# Patient Record
Sex: Female | Born: 2000 | Race: White | Hispanic: No | Marital: Single | Smoking: Never smoker
Health system: Southern US, Community
[De-identification: ages and names within clinical notes are randomized; demographics above are authoritative.]

## PROBLEM LIST (undated history)

## (undated) DIAGNOSIS — F419 Anxiety disorder, unspecified: Secondary | ICD-10-CM

## (undated) DIAGNOSIS — G43909 Migraine, unspecified, not intractable, without status migrainosus: Secondary | ICD-10-CM

## (undated) HISTORY — DX: Anxiety disorder, unspecified: F41.9

## (undated) HISTORY — PX: WISDOM TOOTH EXTRACTION: SHX21

## (undated) HISTORY — DX: Migraine, unspecified, not intractable, without status migrainosus: G43.909

---
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
## 2022-01-03 ENCOUNTER — Encounter: Payer: Self-pay | Admitting: Family

## 2022-01-04 ENCOUNTER — Other Ambulatory Visit: Payer: Self-pay | Admitting: Family

## 2022-01-04 DIAGNOSIS — F902 Attention-deficit hyperactivity disorder, combined type: Secondary | ICD-10-CM

## 2022-01-04 MED ORDER — LISDEXAMFETAMINE DIMESYLATE 20 MG PO CAPS: 40.0000 mg | ORAL_CAPSULE | Freq: Every day | ORAL | 0 refills | Status: DC

## 2022-01-14 ENCOUNTER — Telehealth (INDEPENDENT_AMBULATORY_CARE_PROVIDER_SITE_OTHER): Payer: BC Managed Care – PPO | Admitting: Neurology

## 2022-01-14 ENCOUNTER — Encounter: Payer: Self-pay | Admitting: Neurology

## 2022-01-14 DIAGNOSIS — G43709 Chronic migraine without aura, not intractable, without status migrainosus: Secondary | ICD-10-CM

## 2022-01-14 MED ORDER — AJOVY 225 MG/1.5ML ~~LOC~~ SOAJ: 225.0000 mg | SUBCUTANEOUS | 11 refills | Status: DC

## 2022-01-14 MED ORDER — SUMATRIPTAN SUCCINATE 100 MG PO TABS
ORAL_TABLET | ORAL | 6 refills | Status: DC
Start: 2022-01-14 — End: 2023-02-26

## 2022-01-14 MED ORDER — AJOVY 225 MG/1.5ML ~~LOC~~ SOAJ: 225.0000 mg | SUBCUTANEOUS | 4 refills | Status: DC

## 2022-01-14 NOTE — Progress Notes (Signed)
[facility]    Provider:  Dr Lucia Gaskins Requesting Provider: [name] found Primary Care Provider:  Pcp, No  Virtual Visit via Video Note  I connected with Katie Roberts on 01/14/22 at  3:00 PM EST by a video enabled telemedicine application and verified that I am speaking with the correct person using two identifiers.  Location: Patient: home Provider: office   I discussed the limitations of evaluation and management by telemedicine and the availability of in person appointments. The patient expressed understanding and agreed to proceed.    Follow Up Instructions:    I discussed the assessment and treatment plan with the patient. The patient was provided an opportunity to ask questions and all were answered. The patient agreed with the plan and demonstrated an understanding of the instructions.   The patient was advised to call back or seek an in-person evaluation if the symptoms worsen or if the condition fails to improve as anticipated.  I provided 25 minutes of non-face-to-face time during this encounter.   Anson Fret, MD   CC:  migraines  01/14/2022: No side effects to Ajovy. Extremely improved on Ajovy. She has not woken up or gone to bed with a headache. And the ones she has are mild. imitres works. Continue current regimen, discussed nurtec/ubrelvy but imitrex works  Reviewed images and agree: (additional 10 minutes to appointment time) CTA NECK FINDINGS   Aortic arch: Standard branching. Imaged portion shows no evidence of aneurysm or dissection. No significant stenosis of the major arch vessel origins.   Right carotid system: Detail limited by mild motion. Right carotid system appears normal without stenosis or dissection.   Left carotid system: Detail limited by motion. Normal left carotid without stenosis or dissection.   Vertebral arteries: Both vertebral arteries are patent. Detail limited by motion.   Skeleton:  Negative   Other neck: Negative for mass or adenopathy in the neck.   Upper chest: Lung apices clear bilaterally.   Review of the MIP images confirms the above findings   CTA HEAD FINDINGS   Anterior circulation: Internal carotid artery normally through the skull base and cavernous segment. Anterior and middle cerebral arteries normal bilaterally   Posterior circulation: Both vertebral arteries patent to the basilar. PICA patent bilaterally. Basilar widely patent. Superior cerebellar and posterior cerebral arteries patent bilaterally.   Venous sinuses: Normal venous enhancement   Anatomic variants: None   Review of the MIP images confirms the above findings   IMPRESSION: 1. Image quality degraded by patient motion especially in the neck component of the study. 2. Negative for carotid dissection. Normal carotid and vertebral arteries, allowing for motion 3. Normal CT angio head 4. Normal CT head    Patient complains of symptoms per HPI as well as the following symptoms: migraine . Pertinent negatives and positives per HPI. All others negative   HPI 09/11/2021:  Katie Roberts is a 21 y.o. female here as requested by [name] found for migraines.  She has a past medical history of anxiety, migraines, TMJ pain, sternocleidomastoid muscle tenderness, mixed obsessional thoughts and acts, low ferritin, hair loss, generalized abdominal pain, fatigue, bone marrow donor, B12 deficiency, ADHD, adjustment disorder with anxious mood.  I reviewed notes from Maryjane Hurter who also mentioned OCD, anxiety, depression.  She has a therapist and a psychiatrist that appears.  I reviewed pediatric neurology's notes Dr. Keturah Shavers who she has been seeing for headaches and migraines.  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  Neurologic exam which was extensive was normal.  Last they tried amitriptyline replacing propranolol, continued her on magnesium and vitamin B2, it does not appear as though brain imaging was completed.  It appears [facility], saw Alison Stallingaylor Moore, PA, noted she was getting lots of migraines because of exams, sumatriptan not helping, associated dizziness and nausea, pressure around the ears, photophobic, a Toradol shot was prescribed.  Patient is here and reports that her headaches have been ongoing for many years, mostly right-sided, with tbut around the eye area, photophobia, phonophobia, pulsating pounding throbbing, hurts to move, no aura, nausea no vomiting. She has at least 8 migraine days a month, daily headaches ongoing for > 1 year (started getting headaches since age of 21) radiates to the right side of the neck which is new and worsening (points to the right carotid artery), in the last year radiates to the right side of the neck, worsened since January. At least 24 hours and overnight and worse in the mornings supine, gets blurry vision, can wake with headaches/migraines, sleep and stress can be a trigger, they can be moderate to severe and interfering with life. We spoke about aura and vertigo. She has vertigo and dizziness. No other focal neurologic deficits, associated symptoms, inciting events or modifiable factors.   Reviewed notes, labs and imaging from outside physicians, which showed:  From a thorough review of records, medications tried that can be used in migraine management include: Propranolol, SSRIs, amitriptyline, Flexeril, Lexapro, Prozac, Medrol Dosepak, riboflavin B2, Zoloft,  sumatriptan(helps),maxalt, topiramate contraindicated due to depression.   Review of Systems: Patient complains of symptoms per HPI as well as the following symptoms neck pain. Pertinent negatives and positives per HPI. All others negative.   Social History   Socioeconomic History   Marital status: Single    Spouse name: Not on file   Number of children: Not on file   Years of education: Not on file   Highest education level: Not on file  Occupational History   Not on file  Tobacco Use   Smoking status: Never   Smokeless tobacco: Never  Vaping Use   Vaping Use: Never used  Substance and Sexual Activity   Alcohol use: Not on file   Drug use: Not on file   Sexual activity: Never  Other Topics Concern   Not on file  Social History Narrative   Lives with mom, dad and sibs. She is in the 12th grade at [employer] 22-23 school year.   Social Determinants of Health   Financial Resource Strain: Not on file  Food Insecurity: Not on file  Transportation Needs: Not on file  Physical Activity: Not on file  Stress: Not on file  Social Connections: Not on file  Intimate Partner Violence: Not on file    Family History  Problem Relation Age of Onset   Headache Maternal Uncle    Heart failure Paternal Grandmother    Migraines Neg Hx    Seizures Neg Hx    Autism Neg Hx  ADD / ADHD Neg Hx    Anxiety disorder Neg Hx    Depression Neg Hx    Bipolar disorder Neg Hx    Schizophrenia Neg Hx     Past Medical History:  Diagnosis Date   Anxiety    [employer] 12/29/2019   Migraine     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   Sternocleidomastoid muscle tenderness 07/03/2021   Dysmenorrhea 07/03/2021   Hair loss 05/02/2021   Low ferritin 03/27/2021   Vitamin D deficiency 03/27/2021   B12 deficiency 05/02/2020   Attention deficit hyperactivity disorder  (ADHD), combined type 05/02/2020   Fatigue 04/17/2020   Tension headache 12/29/2019   Chronic migraine without aura without status migrainosus, not intractable 12/29/2019   Anxiety disorder 08/23/2019   Patient prefers no residents 06/23/2019   Mixed obsessional thoughts and acts 01/07/2018   Adjustment disorder with anxious mood 01/07/2018   Generalized abdominal pain 07/09/2017   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     ALTAVERA 0.15-30 MG-MCG tablet TAKE 1 TABLET BY MOUTH EVERY DAY 84 tablet 4   amphetamine-dextroamphetamine (ADDERALL) 5 MG tablet Take 1-2 tablets (5-10 mg total) by mouth daily with lunch. 60 tablet 0   cyanocobalamin (,VITAMIN B-12,) 1000 MCG/ML injection Inject 1 mL (1,000 mcg total) into the muscle every 30 (thirty) days. 3 mL 4   EPINEPHrine 0.3 mg/0.3 mL IJ SOAJ injection SMARTSIG:0.3 Milliliter(s) IM Once PRN     Fremanezumab-vfrm (AJOVY) 225 MG/1.5ML SOAJ Inject 225 mg into the skin every 30 (thirty) days. 4.5 mL 4   levocetirizine (XYZAL) 5 MG tablet Take 5 mg by mouth every evening. PRN     lisdexamfetamine (VYVANSE) 20 MG capsule Take 2 capsules (40 mg total) by mouth daily with breakfast. 60 capsule 0   lisdexamfetamine (VYVANSE) 40 MG capsule Take 1 capsule (40 mg total) by mouth every morning. 30 capsule 0   lisdexamfetamine (VYVANSE) 40 MG capsule Take 1 capsule (40 mg total) by mouth every morning. 30 capsule 0   Magnesium Oxide 400 (240 Mg) MG TABS Take by mouth.      mometasone (NASONEX) 50 MCG/ACT nasal spray Place 2 sprays into the nose daily. 1 each 12   Olopatadine HCl 0.2 % SOLN Apply to eye.     Riboflavin (VITAMIN B-2 PO) Take by mouth.     sertraline (ZOLOFT)  100 MG tablet TAKE 1&1/2 TABLETS (150MG  TOTAL) BY MOUTH DAILY 135 tablet 1   SUMAtriptan (IMITREX) 100 MG tablet May repeat in 2 hours if headache persists or recurs. 18 tablet 6   No current facility-administered medications for this visit.    Allergies as of 01/14/2022 - Review Complete 12/17/2021  Allergen Reaction Noted   Other Shortness Of Breath 12/29/2019   Pollen extract  06/04/2021    Vitals: There were no vitals taken for this visit. Last Weight:  Wt Readings from Last 1 Encounters:  09/13/21 152 lb 12.8 oz (69.3 kg) (84 %, Z= 1.01)*   * Growth percentiles are based on CDC (Girls, 2-20 Years) data.   Last Height:  Ht Readings from Last 1 Encounters:  09/13/21 5' 8.5" (1.74 m) (95 %, Z= 1.67)*   * Growth percentiles are based on CDC (Girls, 2-20 Years) data.    Physical exam: Exam: Gen: NAD, conversant      CV: Could not perform over Web Video. Denies palpitations or chest pain or SOB. VS: Breathing at a normal rate. Weight appears within normal limits. Not febrile. Eyes: Conjunctivae clear without exudates or hemorrhage  Neuro: Detailed Neurologic Exam  Speech:    Speech is normal; fluent and spontaneous with normal comprehension.  Cognition:    The patient is oriented to person, place, and time;     recent and remote memory intact;     language fluent;     normal attention, concentration,     fund of knowledge Cranial Nerves:    The pupils are equal, round, and reactive to light. Cannot perform fundoscopic exam. Visual fields are full to finger confrontation. Extraocular movements are intact.  The face is symmetric with normal sensation. The palate elevates in the midline. Hearing intact. Voice is normal. Shoulder shrug is normal. The tongue has normal motion without fasciculations.   Coordination:    Normal finger to nose  Gait:    Normal native gait  Motor Observation:   no involuntary movements noted. Tone:    Appears normal  Posture:     Posture is normal. normal erect    Strength:    Strength is anti-gravity and symmetric in the upper and lower limbs.      Sensation: intact to LT        Assessment/Plan:  Patient with chronic migraines doing great  continue Ajovy  Acutely: Sumatriptan (also lots of new meds ubrelvy or nurtex, zavzpret), discussed  Discussed: teratogenicity do not get pregnant washout is 6 months  Meds ordered this encounter  Medications   DISCONTD: Fremanezumab-vfrm (AJOVY) 225 MG/1.5ML SOAJ    Sig: Inject 225 mg into the skin every 30 (thirty) days.    Dispense:  1.5 mL    Refill:  11   SUMAtriptan (IMITREX) 100 MG tablet    Sig: May repeat in 2 hours if headache persists or recurs.    Dispense:  18 tablet    Refill:  6    Fill 18 or max allowed by insurance.   Fremanezumab-vfrm (AJOVY) 225 MG/1.5ML SOAJ    Sig: Inject 225 mg into the skin every 30 (thirty) days.    Dispense:  4.5 mL    Refill:  4    Cc: [name] found,  Pcp, No  Naomie Dean, MD  [facility] [address] [place], [place] [address]  Phone [phone] Fax [phone]

## 2022-02-20 ENCOUNTER — Encounter: Payer: Self-pay | Admitting: Family

## 2022-03-01 ENCOUNTER — Encounter (INDEPENDENT_AMBULATORY_CARE_PROVIDER_SITE_OTHER): Payer: Self-pay

## 2022-03-01 ENCOUNTER — Other Ambulatory Visit (INDEPENDENT_AMBULATORY_CARE_PROVIDER_SITE_OTHER): Payer: Self-pay | Admitting: Neurology

## 2022-03-01 NOTE — Telephone Encounter (Signed)
Automated Rx refill request received from [facility]), Request Denied, Patient was able to get Rx filled with 6 refills elsewhere. (See below):  Note: MyChart message sent to patient.  B. Roten CMA   SUMAtriptan (IMITREX) 100 MG tablet [[id number]]   Order Details Dose, Route, Frequency: As Directed  Dispense Quantity: 18 tablet Refills: 6   Note to Pharmacy: Fill 18 or max allowed by insurance.  Indications of Use: Migraine       Sig: May repeat in 2 hours if headache persists or recurs.       Start Date: 01/14/22 End Date: --  Written Date: 01/14/22 Expiration Date: 01/14/23  Original Order: SUMAtriptan (IMITREX) 50 MG tablet [[id number]]  Providers  Authorizing Provider: Melvenia Beam, [place], [place] [address] Phone: [phone]   Fax: [phone] DEA #: [id number]   NPI: [id number]      Ordering User: Melvenia Beam, MD      Pharmacy  [facility], [address] [place] [address] Phone: [phone]  Fax: [phone]

## 2022-03-14 ENCOUNTER — Other Ambulatory Visit: Payer: Self-pay | Admitting: Family

## 2022-03-14 ENCOUNTER — Encounter: Payer: Self-pay | Admitting: Family

## 2022-03-14 MED ORDER — LISDEXAMFETAMINE DIMESYLATE 20 MG PO CAPS: 40.0000 mg | ORAL_CAPSULE | Freq: Every day | ORAL | 0 refills | Status: DC

## 2022-03-18 ENCOUNTER — Telehealth (INDEPENDENT_AMBULATORY_CARE_PROVIDER_SITE_OTHER): Payer: BC Managed Care – PPO | Admitting: Family

## 2022-03-18 DIAGNOSIS — F4323 Adjustment disorder with mixed anxiety and depressed mood: Secondary | ICD-10-CM

## 2022-03-18 DIAGNOSIS — F902 Attention-deficit hyperactivity disorder, combined type: Secondary | ICD-10-CM | POA: Diagnosis not present

## 2022-03-18 NOTE — Progress Notes (Unsigned)
THIS RECORD MAY CONTAIN CONFIDENTIAL INFORMATION THAT SHOULD NOT BE RELEASED WITHOUT REVIEW OF THE SERVICE PROVIDER.  Virtual Follow-Up Visit via Video Note  I connected with Katie Roberts on 03/18/22 at  4:30 PM EST by a video enabled telemedicine application and verified that I am speaking with the correct person using two identifiers.   Patient/parent location: school gym, [place],  [place]  Provider location: office   I discussed the limitations of evaluation and management by telemedicine and the availability of in person appointments.  I discussed that the purpose of this telehealth visit is to provide medical care while limiting exposure to the novel coronavirus.  The patient expressed understanding and agreed to proceed.   Katie Roberts is a 22 y.o. female referred by [name] found here today for follow-up of ADHD.   History was provided by the patient.  Supervising Physician: Dr. Roselind Messier   Plan from Last Visit 12/17/21  1. Attention deficit hyperactivity disorder (ADHD), combined type -continue with Vyvanse 40 mg; does not need short-acting at this time  - lisdexamfetamine (VYVANSE) 40 MG capsule; Take 1 capsule (40 mg total) by mouth every morning.  Dispense: 30 capsule; Refill: 0   2. Adjustment disorder with mixed anxiety and depressed mood -continue with sertraline 150 mg    3. Encounter for birth control pills maintenance -reviewed safe use; EC and condom use  -discussed return precautions     Chief Complaint: ADHD medication - needs refill   History of Present Illness:  -refill Vyvanse 20 mg x 2 for 40 mg total sent on 1/25 -no concerns; things better since back at school  -noticed that her anxiety worsened when she was home for break, then resolved when returned to campus/her typical routine  -taking meds as prescribed; no concerns  -no SI/HI  -no headaches (migraines well-managed by Neuro), no chest pain or SOB, no stomach pains    Allergies   Allergen Reactions   Other Shortness Of Breath    Environmental   Pollen Extract    Outpatient Medications Prior to Visit  Medication Sig Dispense Refill   lisdexamfetamine (VYVANSE) 20 MG capsule Take 2 capsules (40 mg total) by mouth daily. 60 capsule 0   albuterol (VENTOLIN HFA) 108 (90 Base) MCG/ACT inhaler Inhale into the lungs every 6 (six) hours as needed for wheezing or shortness of breath.     ALTAVERA 0.15-30 MG-MCG tablet TAKE 1 TABLET BY MOUTH EVERY DAY 84 tablet 4   amphetamine-dextroamphetamine (ADDERALL) 5 MG tablet Take 1-2 tablets (5-10 mg total) by mouth daily with lunch. 60 tablet 0   cyanocobalamin (,VITAMIN B-12,) 1000 MCG/ML injection Inject 1 mL (1,000 mcg total) into the muscle every 30 (thirty) days. 3 mL 4   EPINEPHrine 0.3 mg/0.3 mL IJ SOAJ injection SMARTSIG:0.3 Milliliter(s) IM Once PRN     Fremanezumab-vfrm (AJOVY) 225 MG/1.5ML SOAJ Inject 225 mg into the skin every 30 (thirty) days. 4.5 mL 4   levocetirizine (XYZAL) 5 MG tablet Take 5 mg by mouth every evening. PRN     Magnesium Oxide 400 (240 Mg) MG TABS Take by mouth.      mometasone (NASONEX) 50 MCG/ACT nasal spray Place 2 sprays into the nose daily. 1 each 12   Olopatadine HCl 0.2 % SOLN Apply to eye.     Riboflavin (VITAMIN B-2 PO) Take by mouth.     sertraline (ZOLOFT) 100 MG tablet TAKE 1&1/2 TABLETS (150MG  TOTAL) BY MOUTH DAILY 135 tablet 1  SUMAtriptan (IMITREX) 100 MG tablet May repeat in 2 hours if headache persists or recurs. 18 tablet 6   No facility-administered medications prior to visit.     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   Sternocleidomastoid muscle tenderness 07/03/2021   Dysmenorrhea 07/03/2021   Hair loss 05/02/2021   Low ferritin 03/27/2021   Vitamin D deficiency 03/27/2021   B12 deficiency 05/02/2020   Attention deficit hyperactivity disorder  (ADHD), combined type 05/02/2020   Fatigue 04/17/2020   Tension headache 12/29/2019   Chronic migraine without aura without status migrainosus, not intractable 12/29/2019   Anxiety disorder 08/23/2019   Patient prefers no residents 06/23/2019   Mixed obsessional thoughts and acts 01/07/2018   Adjustment disorder with anxious mood 01/07/2018   Generalized abdominal pain 07/09/2017   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  Visual Observations/Objective:   General Appearance: Well nourished well developed, in no apparent distress.  Eyes: conjunctiva no swelling or erythema ENT/Mouth: No hoarseness, No cough for duration of visit.  Neck: Supple  Respiratory: Respiratory effort normal, normal rate, no retractions or distress.   Cardio: Appears well-perfused, noncyanotic Musculoskeletal: no obvious deformity Skin: visible skin without rashes, ecchymosis, erythema Neuro: Awake and oriented X 3,  Psych:  normal affect, Insight and Judgment appropriate.    Assessment/Plan: 1. 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 mood -continue with sertraline 150 mg  -Vyvanse 40 mg daily  -return in 3 months or sooner if needed     I discussed the assessment and treatment plan with the patient and/or parent/guardian.  They were provided an opportunity to ask questions and all were answered.  They agreed with the plan and demonstrated an understanding of the instructions. They were advised to call back or seek an in-person evaluation in the emergency room if the symptoms worsen or if the condition fails to improve as anticipated.   Follow-up:   3 months    Parthenia Ames, NP    CC: Pcp, No, No ref.  provider found

## 2022-03-20 ENCOUNTER — Encounter: Payer: Self-pay | Admitting: Family

## 2022-05-29 ENCOUNTER — Encounter: Payer: Self-pay | Admitting: Family

## 2022-08-12 ENCOUNTER — Encounter: Payer: Self-pay | Admitting: Family

## 2022-08-13 ENCOUNTER — Other Ambulatory Visit: Payer: Self-pay | Admitting: Family

## 2022-08-13 MED ORDER — LISDEXAMFETAMINE DIMESYLATE 20 MG PO CAPS
40.0000 mg | ORAL_CAPSULE | Freq: Every day | ORAL | 0 refills | Status: DC
Start: 2022-08-13 — End: 2022-10-09

## 2022-08-29 ENCOUNTER — Encounter: Payer: Self-pay | Admitting: Family

## 2022-09-13 ENCOUNTER — Emergency Department (HOSPITAL_BASED_OUTPATIENT_CLINIC_OR_DEPARTMENT_OTHER)
Admission: EM | Admit: 2022-09-13 | Discharge: 2022-09-13 | Disposition: A | Discharge status: Home / Self Care | Payer: BC Managed Care – PPO | Attending: Emergency Medicine | Admitting: Emergency Medicine

## 2022-09-13 ENCOUNTER — Other Ambulatory Visit: Payer: Self-pay

## 2022-09-13 ENCOUNTER — Encounter (HOSPITAL_BASED_OUTPATIENT_CLINIC_OR_DEPARTMENT_OTHER): Payer: Self-pay

## 2022-09-13 DIAGNOSIS — H43393 Other vitreous opacities, bilateral: Secondary | ICD-10-CM | POA: Diagnosis present

## 2022-09-13 NOTE — ED Notes (Signed)
All appropriate discharge materials reviewed at length with patient. Time for questions provided. Pt has no other questions at this time and verbalizes understanding of all provided materials.  

## 2022-09-13 NOTE — ED Provider Notes (Signed)
[place] [facility] AT [facility] Provider Note   CSN: [id number] Arrival date & time: 09/13/22  1825     History {Add pertinent medical, surgical, social history, OB history to HPI:1} Chief Complaint  Patient presents with   Eye Problem    Katie Roberts is a 22 y.o. female history of anxiety, migraines presented today for evaluation of floaters in her vision for 1 week.  Patient states about a week ago she started to see some black spots in her vision with squeaky lines.  Symptoms waxing and waning. She denies any recent fall or head injury.  She has never had similar symptoms in the past.  Denies any fever, nausea or vomiting.  Denies any vision loss.   Eye Problem     Past Medical History:  Diagnosis Date   Anxiety    [employer] 12/29/2019   Migraine    Past Surgical History:  Procedure Laterality Date   WISDOM TOOTH EXTRACTION       Home Medications Prior to Admission medications   Medication Sig Start Date End Date Taking? Authorizing Provider  albuterol (VENTOLIN HFA) 108 (90 Base) MCG/ACT inhaler Inhale into the lungs every 6 (six) hours as needed for wheezing or shortness of breath.    [provider]  ALTAVERA 0.15-30 MG-MCG tablet TAKE 1 TABLET BY MOUTH EVERY DAY 12/11/21   Georges Mouse, NP  amphetamine-dextroamphetamine (ADDERALL) 5 MG tablet Take 1-2 tablets (5-10 mg total) by mouth daily with lunch. 09/13/21   Verneda Skill, FNP  cyanocobalamin (,VITAMIN B-12,) 1000 MCG/ML injection Inject 1 mL (1,000 mcg total) into the muscle every 30 (thirty) days. 05/01/21   Verneda Skill, FNP  EPINEPHrine 0.3 mg/0.3 mL IJ SOAJ injection SMARTSIG:0.3 Milliliter(s) IM Once PRN 08/01/21   [provider]  Fremanezumab-vfrm (AJOVY) 225 MG/1.5ML SOAJ Inject 225 mg into the skin every 30 (thirty) days. 01/14/22   Anson Fret, MD  levocetirizine (XYZAL) 5 MG tablet Take 5 mg by mouth every evening. PRN    [provider]  lisdexamfetamine (VYVANSE) 20 MG capsule Take 2 capsules (40 mg total) by mouth daily. 08/13/22   Georges Mouse, NP  Magnesium Oxide 400 (240 Mg) MG TABS Take by mouth.     [provider]  mometasone (NASONEX) 50 MCG/ACT nasal spray Place 2 sprays into the nose daily. 08/07/21   Verneda Skill, FNP  Olopatadine HCl 0.2 % SOLN Apply to eye.    [provider]  Riboflavin (VITAMIN B-2 PO) Take by mouth.    [provider]  sertraline (ZOLOFT) 100 MG tablet TAKE 1&1/2 TABLETS (150MG  TOTAL) BY MOUTH DAILY 09/13/21   Verneda Skill, FNP  SUMAtriptan (IMITREX) 100 MG tablet May repeat in 2 hours if headache persists or recurs. 01/14/22   Anson Fret, MD      Allergies    Other and Pollen extract    Review of Systems   Review of Systems Negative except as per HPI.  Physical Exam Updated Vital Signs BP 119/81   Pulse 66   Temp 98.2 F (36.8 C)   Resp 20   Ht 5\' 9"  (1.753 m)   Wt 73.5 kg   SpO2 100%   BMI 23.92 kg/m  Physical Exam Vitals and nursing note reviewed.  Constitutional:      Appearance: Normal appearance.  HENT:     Head: Normocephalic and atraumatic.     Mouth/Throat:     Mouth: Mucous  membranes are moist.  Eyes:     General: No scleral icterus. Cardiovascular:     Rate and Rhythm: Normal rate and regular rhythm.     Pulses: Normal pulses.     Heart sounds: Normal heart sounds.  Pulmonary:     Effort: Pulmonary effort is normal.     Breath sounds: Normal breath sounds.  Abdominal:     General: Abdomen is flat.     Palpations: Abdomen is soft.     Tenderness: There is no abdominal tenderness.  Musculoskeletal:        General: No deformity.  Skin:    General: Skin is warm.     Findings: No rash.  Neurological:     General: No focal deficit present.     Mental Status: She is alert.  Psychiatric:        Mood and Affect: Mood normal.     ED Results / Procedures / Treatments   Labs (all labs ordered  are listed, but only abnormal results are displayed) Labs Reviewed - No data to display  EKG None  Radiology No results found.  Procedures Procedures  {Document cardiac monitor, telemetry assessment procedure when appropriate:1}  Medications Ordered in ED Medications - No data to display  ED Course/ Medical Decision Making/ A&P   {   Click here for ABCD2, HEART and other calculatorsREFRESH Note before signing :1}                          Medical Decision Making  This patient presents to the ED for floaters in vision, this involves an extensive number of treatment options, and is a complaint that carries with a high risk of complications and morbidity.  The differential diagnosis includes ***.  This is not an exhaustive list.  Lab tests: I ordered and personally interpreted labs.  The pertinent results include: WBC unremarkable. Hbg unremarkable. Platelets unremarkable. Electrolytes unremarkable. BUN, creatinine unremarkable. ***  Imaging studies: I ordered imaging studies, personally reviewed, interpreted imaging and agree with the radiologist's interpretations. The results include: ***   Problem list/ ED course/ Critical interventions/ Medical management: HPI: See above Vital signs ***within normal range and stable throughout visit. Laboratory/imaging studies significant for: See above. On physical examination, patient is afebrile and appears in no acute distress. *** I have reviewed the patient home medicines and have made adjustments as needed.  Cardiac monitoring/EKG: The patient was maintained on a cardiac monitor.  I personally reviewed and interpreted the cardiac monitor which showed an underlying rhythm of: sinus rhythm.  Additional history obtained: External records from outside source obtained and reviewed including: Chart review including previous notes, labs, imaging.  Consultations obtained:  Disposition Continued outpatient therapy. Follow-up with PCP  recommended for reevaluation of symptoms. Treatment plan discussed with patient.  Pt acknowledged understanding was agreeable to the plan. Worrisome signs and symptoms were discussed with patient, and patient acknowledged understanding to return to the ED if they noticed these signs and symptoms. Patient was stable upon discharge.   This chart was dictated using voice recognition software.  Despite best efforts to proofread,  errors can occur which can change the documentation meaning.    {Document critical care time when appropriate:1} {Document review of labs and clinical decision tools ie heart score, Chads2Vasc2 etc:1}  {Document your independent review of radiology images, and any outside records:1} {Document your discussion with family members, caretakers, and with consultants:1} {Document social determinants of health affecting pt's care:1} {  Document your decision making why or why not admission, treatments were needed:1} Final Clinical Impression(s) / ED Diagnoses Final diagnoses:  Floaters in visual field, bilateral    Rx / DC Orders ED Discharge Orders     None

## 2022-09-13 NOTE — ED Triage Notes (Signed)
Pt reports floaters in her vision X 1 week.

## 2022-09-13 NOTE — Discharge Instructions (Addendum)
I recommend close follow-up with ophthalmology for reevaluation.  Please do not hesitate to return to emergency department if worrisome signs symptoms we discussed become apparent.

## 2022-10-03 ENCOUNTER — Encounter: Payer: Self-pay | Admitting: Family

## 2022-10-04 ENCOUNTER — Other Ambulatory Visit: Payer: Self-pay | Admitting: Family

## 2022-10-09 ENCOUNTER — Other Ambulatory Visit: Payer: Self-pay | Admitting: Family

## 2022-10-09 DIAGNOSIS — F422 Mixed obsessional thoughts and acts: Secondary | ICD-10-CM

## 2022-10-09 DIAGNOSIS — F902 Attention-deficit hyperactivity disorder, combined type: Secondary | ICD-10-CM

## 2022-10-09 MED ORDER — LISDEXAMFETAMINE DIMESYLATE 20 MG PO CAPS: 40.0000 mg | ORAL_CAPSULE | Freq: Every day | ORAL | 0 refills | Status: DC

## 2022-10-09 MED ORDER — AMPHETAMINE-DEXTROAMPHETAMINE 5 MG PO TABS: 5.0000 mg | ORAL_TABLET | Freq: Every day | ORAL | 0 refills | Status: DC

## 2022-10-09 MED ORDER — SERTRALINE HCL 100 MG PO TABS: ORAL_TABLET | ORAL | 1 refills | Status: DC

## 2022-10-16 ENCOUNTER — Telehealth (INDEPENDENT_AMBULATORY_CARE_PROVIDER_SITE_OTHER): Payer: BC Managed Care – PPO | Admitting: Family

## 2022-10-16 ENCOUNTER — Encounter: Payer: Self-pay | Admitting: Family

## 2022-10-16 DIAGNOSIS — F902 Attention-deficit hyperactivity disorder, combined type: Secondary | ICD-10-CM

## 2022-10-16 DIAGNOSIS — F4323 Adjustment disorder with mixed anxiety and depressed mood: Secondary | ICD-10-CM

## 2022-10-16 NOTE — Progress Notes (Addendum)
THIS RECORD MAY CONTAIN CONFIDENTIAL INFORMATION THAT SHOULD NOT BE RELEASED WITHOUT REVIEW OF THE SERVICE PROVIDER.  Virtual Follow-Up Visit via Video Note  I connected with Katie Roberts  on 10/16/22 at  3:30 PM EDT by a video enabled telemedicine application and verified that I am speaking with the correct person using two identifiers.   Patient/parent location: [employer], confidential room, Aeronautical engineer location: remote [place]    I discussed the limitations of evaluation and management by telemedicine and the availability of in person appointments.  I discussed that the purpose of this telehealth visit is to provide medical care while limiting exposure to the novel coronavirus.  The patient expressed understanding and agreed to proceed.   Katie Roberts is a 22 y.o. female referred by [name] found here today for follow-up of adjustment disorder with mixed anxiety and depressed mood, ADHD.   History was provided by the patient.  Supervising Physician: Dr. Theadore Nan   Plan from Last Visit 03/18/22    1. 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 mood -continue with sertraline 150 mg  -Vyvanse 40 mg daily  -return in 3 months or sooner if needed   Chief Complaint: Restarted meds   History of Present Illness:  -restarted all her meds after about a month off them and did not feel well yesterday; had some racing heart and an upset stomach, didn't feel great -today took only sertraline 50 mg and the Vyvanse and feels better -no headache, no upset stomach, no racing heart rate today  -feels like her mood is good, is able to focus   -LMP last week  -had stabbing pelvic pain off and on for a while - 30 min to a couple seconds for about a week and a half; having some external down there pain (labial) that feels like tingling; no pain or burning with urination; no fever, nausea or  vomiting; questioned if she had a UTI; saw campus MD and also was tested for STIs and that was negative; was advised to return if symptoms continued    Allergies  Allergen Reactions   Other Shortness Of Breath    Environmental   Pollen Extract    Outpatient Medications Prior to Visit  Medication Sig Dispense Refill   albuterol (VENTOLIN HFA) 108 (90 Base) MCG/ACT inhaler Inhale into the lungs every 6 (six) hours as needed for wheezing or shortness of breath.     ALTAVERA 0.15-30 MG-MCG tablet TAKE 1 TABLET BY MOUTH EVERY DAY 84 tablet 4   amphetamine-dextroamphetamine (ADDERALL) 5 MG tablet Take 1-2 tablets (5-10 mg total) by mouth daily with lunch. 60 tablet 0   cyanocobalamin (,VITAMIN B-12,) 1000 MCG/ML injection Inject 1 mL (1,000 mcg total) into the muscle every 30 (thirty) days. 3 mL 4   EPINEPHrine 0.3 mg/0.3 mL IJ SOAJ injection SMARTSIG:0.3 Milliliter(s) IM Once PRN     Fremanezumab-vfrm (AJOVY) 225 MG/1.5ML SOAJ Inject 225 mg into the skin every 30 (thirty) days. 4.5 mL 4   levocetirizine (XYZAL) 5 MG tablet Take 5 mg by mouth every evening. PRN     lisdexamfetamine (VYVANSE) 20 MG capsule Take 2 capsules (40 mg total) by mouth daily. 60 capsule 0   Magnesium Oxide 400 (240 Mg) MG TABS Take by mouth.      mometasone (NASONEX) 50 MCG/ACT nasal spray Place 2 sprays into the nose daily. 1 each 12   Olopatadine HCl 0.2 %  SOLN Apply to eye.     Riboflavin (VITAMIN B-2 PO) Take by mouth.     sertraline (ZOLOFT) 100 MG tablet TAKE 1&1/2 TABLETS (150MG  TOTAL) BY MOUTH DAILY 135 tablet 1   SUMAtriptan (IMITREX) 100 MG tablet May repeat in 2 hours if headache persists or recurs. 18 tablet 6   No facility-administered medications prior to visit.     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   Sternocleidomastoid muscle tenderness 07/03/2021   Dysmenorrhea  07/03/2021   Hair loss 05/02/2021   Low ferritin 03/27/2021   Vitamin D deficiency 03/27/2021   B12 deficiency 05/02/2020   Attention deficit hyperactivity disorder (ADHD), combined type 05/02/2020   Fatigue 04/17/2020   Tension headache 12/29/2019   Chronic migraine without aura without status migrainosus, not intractable 12/29/2019   Anxiety disorder 08/23/2019   Patient prefers no residents 06/23/2019   Mixed obsessional thoughts and acts 01/07/2018   Adjustment disorder with anxious mood 01/07/2018   Generalized abdominal pain 07/09/2017   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  Visual Observations/Objective:   General Appearance: Well nourished well developed, in no apparent distress.  Eyes: conjunctiva no swelling or erythema ENT/Mouth: No hoarseness, No cough for duration of visit.  Neck: Supple  Respiratory: Respiratory effort normal, normal rate, no retractions or distress.   Cardio: Appears well-perfused, noncyanotic Musculoskeletal: no obvious deformity Skin: visible skin without rashes, ecchymosis, erythema Neuro: Awake and oriented X 3,  Psych:  normal affect, Insight and Judgment appropriate.    Assessment/Plan: 1. 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 mood -discussed serotonin syndrome symptoms and advised to hold on increasing dose; return precautions reviewed; discussed that she may not need same dose as she was on before; she is in agreement to hold at sertraline 50 mg only   -OK to continue Vyvanse 20 mg daily and Adderall 5 mg as needed c  -advised to return to [facility] to follow up on urinary symptoms/pelvic pain; increase fluid intake; no concern for acute  abdomen.   I discussed the assessment and treatment plan with the patient and/or parent/guardian.  They were provided an opportunity to ask questions and all were answered.  They agreed with the plan and demonstrated an understanding of the instructions. They were advised to call back or seek an in-person evaluation in the emergency room if the symptoms worsen or if the condition fails to improve as anticipated.   Follow-up:   2 weeks video or sooner if needed    Georges Mouse, NP    CC: Pcp, No, [name] found

## 2022-10-18 ENCOUNTER — Encounter: Payer: Self-pay | Admitting: Neurology

## 2022-10-18 ENCOUNTER — Telehealth: Payer: Self-pay

## 2022-10-18 ENCOUNTER — Encounter: Payer: Self-pay | Admitting: Family

## 2022-10-18 NOTE — Telephone Encounter (Signed)
Gave patient a call to schedule 2wks follow up. Patient was currently in a doctors appointment so I advised her to give [place] a call back at the office to schedule her follow up appointment.

## 2022-10-22 MED ORDER — AJOVY 225 MG/1.5ML ~~LOC~~ SOAJ: 225.0000 mg | SUBCUTANEOUS | 0 refills | Status: DC

## 2022-11-08 ENCOUNTER — Telehealth (INDEPENDENT_AMBULATORY_CARE_PROVIDER_SITE_OTHER): Payer: Self-pay | Admitting: Family

## 2022-11-08 ENCOUNTER — Encounter: Payer: Self-pay | Admitting: Family

## 2022-11-08 DIAGNOSIS — F902 Attention-deficit hyperactivity disorder, combined type: Secondary | ICD-10-CM

## 2022-11-08 DIAGNOSIS — F4323 Adjustment disorder with mixed anxiety and depressed mood: Secondary | ICD-10-CM

## 2022-11-08 NOTE — Progress Notes (Signed)
THIS RECORD MAY CONTAIN CONFIDENTIAL INFORMATION THAT SHOULD NOT BE RELEASED WITHOUT REVIEW OF THE SERVICE PROVIDER.  Virtual Follow-Up Visit via Video Note  I connected with Katie Roberts  on 11/08/22 at 10:30 AM EDT by a video enabled telemedicine application and verified that I am speaking with the correct person using two identifiers.   Patient/parent location: [place], [place]  Provider location: remote [place]    I discussed the limitations of evaluation and management by telemedicine and the availability of in person appointments.  I discussed that the purpose of this telehealth visit is to provide medical care while limiting exposure to the novel coronavirus.  The patient expressed understanding and agreed to proceed.   Katie Roberts is a 22 y.o. female referred by [name] found here today for follow-up of ADHD, adjustment disorder with mixed anxiety and depressed mood.   History was provided by the patient.  Supervising Physician: Dr. Theadore Nan   Plan from Last Visit:    1. 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 mood -continue with sertraline 150 mg  -Vyvanse 40 mg daily  -return in 3 months or sooner if needed    Chief Complaint: Restarted meds    History of Present Illness:  -restarted all her meds after about a month off them and did not feel well yesterday; had some racing heart and an upset stomach, didn't feel great -today took only sertraline 50 mg and the Vyvanse and feels better -no headache, no upset stomach, no racing heart rate today  -feels like her mood is good, is able to focus    -LMP last week  -had stabbing pelvic pain off and on for a while - 30 min to a c  Chief Complaint: Upset stomach with medication   History of Present Illness:  -Setraline 50 mg, Vvyanse 40 mg; mood is good but is noticing she will have upset stomach after she takes the  medication -eating breakfast  -had reaction to Flagyl/Diflucan and was seen at ED on 10/18/22 after taking medication and felt dizzy and lightheaded about  3 hours after taking medication; had some nausea, no emesis; some itching and facial flushing. Meds were switched to clindamycin 300 mg twice daily for 6 days outpatient, per chart review.  -no fever, chills, no sick contacts, no constipation, no appetite concerns   Allergies  Allergen Reactions   Other Shortness Of Breath    Environmental   Pollen Extract    Outpatient Medications Prior to Visit  Medication Sig Dispense Refill   albuterol (VENTOLIN HFA) 108 (90 Base) MCG/ACT inhaler Inhale into the lungs every 6 (six) hours as needed for wheezing or shortness of breath.     ALTAVERA 0.15-30 MG-MCG tablet TAKE 1 TABLET BY MOUTH EVERY DAY 84 tablet 4   amphetamine-dextroamphetamine (ADDERALL) 5 MG tablet Take 1-2 tablets (5-10 mg total) by mouth daily with lunch. 60 tablet 0   cyanocobalamin (,VITAMIN B-12,) 1000 MCG/ML injection Inject 1 mL (1,000 mcg total) into the muscle every 30 (thirty) days. 3 mL 4   EPINEPHrine 0.3 mg/0.3 mL IJ SOAJ injection SMARTSIG:0.3 Milliliter(s) IM Once PRN     Fremanezumab-vfrm (AJOVY) 225 MG/1.5ML SOAJ Inject 225 mg into the skin every 30 (thirty) days. 4.5 mL 0   levocetirizine (XYZAL) 5 MG tablet Take 5 mg by mouth every evening. PRN     lisdexamfetamine (VYVANSE) 20 MG capsule Take 2 capsules (40 mg total) by mouth  daily. 60 capsule 0   Magnesium Oxide 400 (240 Mg) MG TABS Take by mouth.      mometasone (NASONEX) 50 MCG/ACT nasal spray Place 2 sprays into the nose daily. 1 each 12   Olopatadine HCl 0.2 % SOLN Apply to eye.     Riboflavin (VITAMIN B-2 PO) Take by mouth.     sertraline (ZOLOFT) 100 MG tablet TAKE 1&1/2 TABLETS (150MG  TOTAL) BY MOUTH DAILY 135 tablet 1   SUMAtriptan (IMITREX) 100 MG tablet May repeat in 2 hours if headache persists or recurs. 18 tablet 6   No facility-administered  medications prior to visit.     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   Sternocleidomastoid muscle tenderness 07/03/2021   Dysmenorrhea 07/03/2021   Hair loss 05/02/2021   Low ferritin 03/27/2021   Vitamin D deficiency 03/27/2021   B12 deficiency 05/02/2020   Attention deficit hyperactivity disorder (ADHD), combined type 05/02/2020   Fatigue 04/17/2020   Tension headache 12/29/2019   Chronic migraine without aura without status migrainosus, not intractable 12/29/2019   Anxiety disorder 08/23/2019   Patient prefers no residents 06/23/2019   Mixed obsessional thoughts and acts 01/07/2018   Adjustment disorder with anxious mood 01/07/2018   Generalized abdominal pain 07/09/2017   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  Visual Observations/Objective:   General Appearance: Well nourished well developed, in no apparent distress.  Eyes: conjunctiva no swelling or erythema ENT/Mouth: No hoarseness, No cough for duration of visit.  Neck: Supple  Respiratory: Respiratory effort normal, normal rate, no retractions or distress.   Cardio: Appears well-perfused, noncyanotic Musculoskeletal: no obvious deformity Skin: visible skin without rashes, ecchymosis, erythema Neuro: Awake and oriented X 3,  Psych:  normal affect, Insight and Judgment appropriate.    Assessment/Plan: 1. 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 mood  Timeline of restarting sertraline and Vyvanse and initiation of medication for BV/vaginal infection by [facility], plus reaction to  Flagyl/Diflucan and switch to clindamycin makes it difficult to ascertain if this is side effect of SSRI or stimulant use or GI upset from antibiotic use. Advised she could take Vyvanse in AM and try sertraline in PM to see if this alleviates symptoms. Also could consider probiotic for gut health. She will try to separate doses and follow up through My Chart. Return precautions reviewed. Reviewed EKG from ED visit; no prolonged QT.    Encounter Date: 10/18/22  ECG 12 Lead  Result Value  EKG Systolic BP  EKG Diastolic BP  EKG Ventricular Rate 88  EKG Atrial Rate 88  EKG P-R Interval 124  EKG QRS Duration 84  EKG Q-T Interval 358  EKG QTC Calculation 433  EKG Calculated P Axis 252  EKG Calculated R Axis 33  EKG Calculated T Axis 25  QTC Fredericia 406  Narrative  Unusual P axis and short PR, probable junctional tachycardia Abnormal ECG No previous ECGs available   I discussed the assessment and treatment plan with the patient and/or parent/guardian.  They were provided an opportunity to ask questions and all were answered.  They agreed with the plan and demonstrated an understanding of the instructions. They were  advised to call back or seek an in-person evaluation in the emergency room if the symptoms worsen or if the condition fails to improve as anticipated.   Follow-up:  as needed, will follow up via My Chart with new or worsening.    Georges Mouse, NP    CC: Pcp, No, [name] found

## 2022-11-20 ENCOUNTER — Encounter: Payer: Self-pay | Admitting: Family

## 2022-12-31 ENCOUNTER — Encounter: Payer: Self-pay | Admitting: Family

## 2022-12-31 ENCOUNTER — Other Ambulatory Visit: Payer: Self-pay | Admitting: Family

## 2022-12-31 ENCOUNTER — Encounter: Payer: Self-pay | Admitting: Neurology

## 2022-12-31 DIAGNOSIS — N946 Dysmenorrhea, unspecified: Secondary | ICD-10-CM

## 2022-12-31 DIAGNOSIS — F422 Mixed obsessional thoughts and acts: Secondary | ICD-10-CM

## 2022-12-31 MED ORDER — AJOVY 225 MG/1.5ML ~~LOC~~ SOAJ: 225.0000 mg | SUBCUTANEOUS | 0 refills | Status: DC

## 2022-12-31 MED ORDER — LEVONORGESTREL-ETHINYL ESTRAD 0.15-30 MG-MCG PO TABS: 1.0000 | ORAL_TABLET | Freq: Every day | ORAL | 4 refills | Status: DC

## 2022-12-31 MED ORDER — LISDEXAMFETAMINE DIMESYLATE 20 MG PO CAPS: 40.0000 mg | ORAL_CAPSULE | Freq: Every day | ORAL | 0 refills | Status: DC

## 2022-12-31 MED ORDER — SERTRALINE HCL 100 MG PO TABS: ORAL_TABLET | ORAL | 1 refills | Status: DC

## 2023-01-06 ENCOUNTER — Other Ambulatory Visit: Payer: Self-pay | Admitting: Family

## 2023-01-08 ENCOUNTER — Encounter: Payer: Self-pay | Admitting: Family

## 2023-01-08 ENCOUNTER — Telehealth (INDEPENDENT_AMBULATORY_CARE_PROVIDER_SITE_OTHER): Payer: BC Managed Care – PPO | Admitting: Family

## 2023-01-08 DIAGNOSIS — F902 Attention-deficit hyperactivity disorder, combined type: Secondary | ICD-10-CM | POA: Diagnosis not present

## 2023-01-08 DIAGNOSIS — N946 Dysmenorrhea, unspecified: Secondary | ICD-10-CM

## 2023-01-08 DIAGNOSIS — F4323 Adjustment disorder with mixed anxiety and depressed mood: Secondary | ICD-10-CM

## 2023-01-08 MED ORDER — LEVONORGESTREL-ETHINYL ESTRAD 0.15-30 MG-MCG PO TABS: 1.0000 | ORAL_TABLET | Freq: Every day | ORAL | 4 refills | Status: AC

## 2023-01-08 NOTE — Progress Notes (Signed)
THIS RECORD MAY CONTAIN CONFIDENTIAL INFORMATION THAT SHOULD NOT BE RELEASED WITHOUT REVIEW OF THE SERVICE PROVIDER.  Virtual Follow-Up Visit via Video Note  I connected with Katie Roberts   on 01/08/23 at  3:30 PM EST by a video enabled telemedicine application and verified that I am speaking with the correct person using two identifiers.   Patient/parent location: apartment, [facility] [place]  Provider location: remote, [place]    I discussed the limitations of evaluation and management by telemedicine and the availability of in person appointments.  I discussed that the purpose of this telehealth visit is to provide medical care while limiting exposure to the novel coronavirus.  The patient expressed understanding and agreed to proceed.   Katie Roberts is a 22 y.o. female referred by [name] found here today for follow-up of ADHD, combined type, adjustment disorder with mixed anxiety and depressed mood.   History was provided by the patient.  Supervising Physician: Dr. Theadore Nan   Plan from Last Visit:   1. 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 mood   Timeline of restarting sertraline and Vyvanse and initiation of medication for BV/vaginal infection by [facility], plus reaction to Flagyl/Diflucan and switch to clindamycin makes it difficult to ascertain if this is side effect of SSRI or stimulant use or GI upset from antibiotic use. Advised she could take Vyvanse in AM and try sertraline in PM to see if this alleviates symptoms. Also could consider probiotic for gut health. She will try to separate doses and follow up through My Chart. Return precautions reviewed. Reviewed EKG from ED visit; no prolonged QT.     Encounter Date: 10/18/22  ECG 12 Lead  Result Value  EKG Systolic BP  EKG Diastolic BP  EKG Ventricular Rate 88  EKG Atrial Rate 88  EKG P-R Interval 124  EKG QRS Duration 84   EKG Q-T Interval 358  EKG QTC Calculation 433  EKG Calculated P Axis 252  EKG Calculated R Axis 33  EKG Calculated T Axis 25  QTC Fredericia 406  Narrative  Unusual P axis and short PR, probable junctional tachycardia Abnormal ECG No previous ECGs available     Chief Complaint: Side effects from different brand of birth control  More anxiety   Pertinent Chart Review: ED 01/06/23  ED Course:  22 year old female with acute gastritis, GERD, nausea and chest pain secondary to GERD. From her description, when she is feeling well she still has episodes of indigestion type symptoms. At urgent care, she had negative pregnancy testing and negative urinalysis. Here in ED workup is reassuring with no leukocytosis. Normal hemoglobin. Normal kidney function. Normal LFTs. Normal lipase. Negative D-dimer. Respiratory swab is negative. UDS positive for amphetamines which is consistent with her medication profile of Vyvanse. EKG normal sinus tachycardia. Suspect that patient has an underlying GERD with a viral GI illness causing a gastritis and acute flare of her GERD with chest pain secondary to the GERD. She is given Zofran, wait 20 minutes with GI cocktail and significant improvement in symptoms. Tachycardia is resolved p treatment. Will send Zofran to pharmacy. Recommend Pepcid chewable complete twice daily for the next 5 days to get through viral episode. Discussed with her bland diet and if symptoms persist after this to consider PPI trial for the next 4 weeks. Consider follow-up with primary for further evaluation if symptoms persist beyond this. She did have an elevated blood pressure reading  here. May be secondary to the Vyvanse but encouraged recheck of this to ensure she is not elevated at other times. No acute concerns otherwise today. Differentials-broad differentials considered, gastritis, gastroenteritis, colitis, PE, UTI, pregnancy, electrolyte disturbance, pancreatitis, dehydration,  substance-induced symptoms, anemia, thyroid abnormality, renal artery stenosis-this list is not all inclusive. Discharged in stable condition.   History of Present Illness:  -breast tenderness since change in birth control brand  -only received one month (one pack) instead of the normal 3 packs; also same formulation but different brand  -more faint, more cramping  -has had more anxiety lately; feels she is less focused but also having more heart racing, symptoms of anxiety  -no SI/HI, no self harm; denies substance use -since decreasing sertraline from 100 mg to 50 mg, thinks this could be the cause but also not sure if the Vyvanse is right; she has the short-acting Adderall but has not taken it; is currently taking Vyvanse 40 mg.    Allergies  Allergen Reactions   Other Shortness Of Breath    Environmental   Pollen Extract    Outpatient Medications Prior to Visit  Medication Sig Dispense Refill   albuterol (VENTOLIN HFA) 108 (90 Base) MCG/ACT inhaler Inhale into the lungs every 6 (six) hours as needed for wheezing or shortness of breath.     amphetamine-dextroamphetamine (ADDERALL) 5 MG tablet Take 1-2 tablets (5-10 mg total) by mouth daily with lunch. 60 tablet 0   cyanocobalamin (,VITAMIN B-12,) 1000 MCG/ML injection Inject 1 mL (1,000 mcg total) into the muscle every 30 (thirty) days. 3 mL 4   EPINEPHrine 0.3 mg/0.3 mL IJ SOAJ injection SMARTSIG:0.3 Milliliter(s) IM Once PRN     Fremanezumab-vfrm (AJOVY) 225 MG/1.5ML SOAJ Inject 225 mg into the skin every 30 (thirty) days. 4.5 mL 0   levocetirizine (XYZAL) 5 MG tablet Take 5 mg by mouth every evening. PRN     levonorgestrel-ethinyl estradiol (ALTAVERA) 0.15-30 MG-MCG tablet Take 1 tablet by mouth daily. 84 tablet 4   lisdexamfetamine (VYVANSE) 20 MG capsule Take 2 capsules (40 mg total) by mouth daily. 60 capsule 0   Magnesium Oxide 400 (240 Mg) MG TABS Take by mouth.      mometasone (NASONEX) 50 MCG/ACT nasal spray Place 2 sprays  into the nose daily. 1 each 12   Olopatadine HCl 0.2 % SOLN Apply to eye.     Riboflavin (VITAMIN B-2 PO) Take by mouth.     sertraline (ZOLOFT) 100 MG tablet TAKE 1&1/2 TABLETS (150MG  TOTAL) BY MOUTH DAILY 135 tablet 1   SUMAtriptan (IMITREX) 100 MG tablet May repeat in 2 hours if headache persists or recurs. 18 tablet 6   No facility-administered medications prior to visit.     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   Sternocleidomastoid muscle tenderness 07/03/2021   Dysmenorrhea 07/03/2021   Hair loss 05/02/2021   Low ferritin 03/27/2021   Vitamin D deficiency 03/27/2021   B12 deficiency 05/02/2020   Attention deficit hyperactivity disorder (ADHD), combined type 05/02/2020   Fatigue 04/17/2020   Tension headache 12/29/2019   Chronic migraine without aura without status migrainosus, not intractable 12/29/2019   Anxiety disorder 08/23/2019   Patient prefers no residents 06/23/2019   Mixed obsessional thoughts and acts 01/07/2018   Adjustment disorder with anxious mood 01/07/2018   Generalized abdominal pain 07/09/2017   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  Visual Observations/Objective:   General Appearance: Well nourished well developed, in no apparent distress.  Eyes: conjunctiva no swelling or erythema ENT/Mouth: No hoarseness, No cough for duration of visit.  Neck: Supple  Respiratory: Respiratory effort normal, normal rate, no retractions or distress.   Cardio: Appears well-perfused, noncyanotic Musculoskeletal: no obvious deformity Skin: visible skin without rashes, ecchymosis, erythema Neuro: Awake and oriented X 3,  Psych:   normal affect, Insight and Judgment appropriate.    Assessment/Plan: 1. Dysmenorrhea -advised to call pharmacy; Refill sent to [facility] as she is returning home next week; recommend return to [facility] brand as she is having side effects from other one.  - levonorgestrel-ethinyl estradiol (ALTAVERA) 0.15-30 MG-MCG tablet; Take 1 tablet by mouth daily.  Dispense: 84 tablet; Refill: 4  2. Attention deficit hyperactivity disorder (ADHD), combined type 3. Adjustment disorder with mixed anxiety and depressed mood -continue with sertraline 50 mg  -advised to try Adderall 5 or 10 mg in AM; stop Vyvanse and assess symptoms  -she will reach out via My Chart in coming days to see if this improves symptoms.    I discussed the assessment and treatment plan with the patient and/or parent/guardian.  They were provided an opportunity to ask questions and all were answered.  They agreed with the plan and demonstrated an understanding of the instructions. They were advised to call back or seek an in-person evaluation in the emergency room if the symptoms worsen or if the condition fails to improve as anticipated.   Follow-up:   pending My Chart follow-up    Georges Mouse, NP    CC: Pcp, No, [name] found

## 2023-01-13 ENCOUNTER — Other Ambulatory Visit: Payer: Self-pay | Admitting: Family

## 2023-01-13 ENCOUNTER — Encounter: Payer: Self-pay | Admitting: Family

## 2023-01-13 DIAGNOSIS — F4322 Adjustment disorder with anxiety: Secondary | ICD-10-CM

## 2023-01-13 DIAGNOSIS — F422 Mixed obsessional thoughts and acts: Secondary | ICD-10-CM

## 2023-01-13 DIAGNOSIS — F902 Attention-deficit hyperactivity disorder, combined type: Secondary | ICD-10-CM

## 2023-01-13 MED ORDER — HYDROXYZINE HCL 10 MG PO TABS: 10.0000 mg | ORAL_TABLET | Freq: Three times a day (TID) | ORAL | 1 refills | Status: DC | PRN

## 2023-02-25 ENCOUNTER — Telehealth: Payer: Self-pay | Admitting: Neurology

## 2023-02-25 NOTE — Telephone Encounter (Signed)
 I called patient.  I see no reason why she cannot have a virtual visit.  She reports no new symptoms.  She reports that her migraines are well-controlled on Ajovy and sumatriptan as needed.  I confirmed her medication list is current and correct.

## 2023-02-25 NOTE — Telephone Encounter (Signed)
 Pt called to schedule follow up appt needed for refills with any NP. Pt was scheduled with Amy as an OV but requesting a VV if possible. Requesting call back

## 2023-02-26 NOTE — Patient Instructions (Signed)
 Below is our plan:  We will continue Ajovy  and sumatriptan   Please make sure you are staying well hydrated. I recommend 50-60 ounces daily. Well balanced diet and regular exercise encouraged. Consistent sleep schedule with 6-8 hours recommended.   Please continue follow up with care team as directed.   Follow up with me in 1 year   You may receive a survey regarding today's visit. I encourage you to leave honest feed back as I do use this information to improve patient care. Thank you for seeing me today!   GENERAL HEADACHE INFORMATION:   Natural supplements: Magnesium Oxide or Magnesium Glycinate 500 mg at bed (up to 800 mg daily) Coenzyme Q10 300 mg in AM Vitamin B2- 200 mg twice a day   Add 1 supplement at a time since even natural supplements can have undesirable side effects. You can sometimes buy supplements cheaper (especially Coenzyme Q10) at www.webmailguide.co.za or at [facility].  Migraine with aura: There is increased risk for stroke in women with migraine with aura and a contraindication for the combined contraceptive pill for use by women who have migraine with aura. The risk for women with migraine without aura is lower. However other risk factors like smoking are far more likely to increase stroke risk than migraine. There is a recommendation for no smoking and for the use of OCPs without estrogen such as progestogen only pills particularly for women with migraine with aura.SABRA People who have migraine headaches with auras may be 3 times more likely to have a stroke caused by a blood clot, compared to migraine patients who don't see auras. Women who take hormone-replacement therapy may be 30 percent more likely to suffer a clot-based stroke than women not taking medication containing estrogen. Other risk factors like smoking and high blood pressure may be  much more important.    Vitamins and herbs that show potential:   Magnesium: Magnesium (250 mg twice a day or 500 mg at bed) has a  relaxant effect on smooth muscles such as blood vessels. Individuals suffering from frequent or daily headache usually have low magnesium levels which can be increase with daily supplementation of 400-750 mg. Three trials found 40-90% average headache reduction  when used as a preventative. Magnesium may help with headaches are aura, the best evidence for magnesium is for migraine with aura is its thought to stop the cortical spreading depression we believe is the pathophysiology of migraine aura.Magnesium also demonstrated the benefit in menstrually related migraine.  Magnesium is part of the messenger system in the serotonin cascade and it is a good muscle relaxant.  It is also useful for constipation which can be a side effect of other medications used to treat migraine. Good sources include nuts, whole grains, and tomatoes. Side Effects: loose stool/diarrhea  Riboflavin (vitamin B 2) 200 mg twice a day. This vitamin assists nerve cells in the production of ATP a principal energy storing molecule.  It is necessary for many chemical reactions in the body.  There have been at least 3 clinical trials of riboflavin using 400 mg per day all of which suggested that migraine frequency can be decreased.  All 3 trials showed significant improvement in over half of migraine sufferers.  The supplement is found in bread, cereal, milk, meat, and poultry.  Most Americans get more riboflavin than the recommended daily allowance, however riboflavin deficiency is not necessary for the supplements to help prevent headache. Side effects: energizing, green urine   Coenzyme Q10: This is present  in almost all cells in the body and is critical component for the conversion of energy.  Recent studies have shown that a nutritional supplement of CoQ10 can reduce the frequency of migraine attacks by improving the energy production of cells as with riboflavin.  Doses of 150 mg twice a day have been shown to be effective.   Melatonin:  Increasing evidence shows correlation between melatonin secretion and headache conditions.  Melatonin supplementation has decreased headache intensity and duration.  It is widely used as a sleep aid.  Sleep is natures way of dealing with migraine.  A dose of 3 mg is recommended to start for headaches including cluster headache. Higher doses up to 15 mg has been reviewed for use in Cluster headache and have been used. The rationale behind using melatonin for cluster is that many theories regarding the cause of Cluster headache center around the disruption of the normal circadian rhythm in the brain.  This helps restore the normal circadian rhythm.   HEADACHE DIET: Foods and beverages which may trigger migraine Note that only 20% of headache patients are food sensitive. You will know if you are food sensitive if you get a headache consistently 20 minutes to 2 hours after eating a certain food. Only cut out a food if it causes headaches, otherwise you might remove foods you enjoy! What matters most for diet is to eat a well balanced healthy diet full of vegetables and low fat protein, and to not miss meals.   Chocolate, other sweets ALL cheeses except cottage and cream cheese Dairy products, yogurt, sour cream, ice cream Liver Meat extracts (Bovril, Marmite, meat tenderizers) Meats or fish which have undergone aging, fermenting, pickling or smoking. These include: Hotdogs,salami,Lox,sausage, mortadellas,smoked salmon, pepperoni, Pickled herring Pods of broad bean (English beans, Chinese pea pods, Italian (fava) beans, lima and navy beans Ripe avocado, ripe banana Yeast extracts or active yeast preparations such as Brewer's or Fleishman's (commercial bakes goods are permitted) Tomato based foods, pizza (lasagna, etc.)   MSG (monosodium glutamate) is disguised as many things; look for these common aliases: Monopotassium glutamate Autolysed yeast Hydrolysed protein Sodium  caseinate "flavorings" "all natural preservatives Nutrasweet   Avoid all other foods that convincingly provoke headaches.   Resources: The Dizzy Bluford Aid Your Headache Diet, migrainestrong.com  https://zamora-andrews.com/   Caffeine and Migraine For patients that have migraine, caffeine intake more than 3 days per week can lead to dependency and increased migraine frequency. I would recommend cutting back on your caffeine intake as best you can. The recommended amount of caffeine is 200-300 mg daily, although migraine patients may experience dependency at even lower doses. While you may notice an increase in headache temporarily, cutting back will be helpful for headaches in the long run. For more information on caffeine and migraine, visit: https://americanmigrainefoundation.org/resource-library/caffeine-and-migraine/   Headache Prevention Strategies:   1. Maintain a headache diary; learn to identify and avoid triggers.  - This can be a simple note where you log when you had a headache, associated symptoms, and medications used - There are several smartphone apps developed to help track migraines: Migraine Buddy, Migraine Monitor, Curelator N1-Headache App   Common triggers include: Emotional triggers: Emotional/Upset family or friends Emotional/Upset occupation Business reversal/success Anticipation anxiety Crisis-serious Post-crisis periodNew job/position   Physical triggers: Vacation Day Weekend Strenuous Exercise High Altitude Location New Move Menstrual Day Physical Illness Oversleep/Not enough sleep Weather changes Light: Photophobia or light sesnitivity treatment involves a balance between desensitization and reduction in overly strong input. Use  dark polarized glasses outside, but not inside. Avoid bright or fluorescent light, but do not dim environment to the point that going into a normally lit room hurts. Consider  FL-41 tint lenses, which reduce the most irritating wavelengths without blocking too much light.  These can be obtained at axonoptics.com or theraspecs.com Foods: see list above.   2. Limit use of acute treatments (over-the-counter medications, triptans, etc.) to no more than 2 days per week or 10 days per month to prevent medication overuse headache (rebound headache).     3. Follow a regular schedule (including weekends and holidays): Don't skip meals. Eat a balanced diet. 8 hours of sleep nightly. Minimize stress. Exercise 30 minutes per day. Being overweight is associated with a 5 times increased risk of chronic migraine. Keep well hydrated and drink 6-8 glasses of water per day.   4. Initiate non-pharmacologic measures at the earliest onset of your headache. Rest and quiet environment. Relax and reduce stress. Breathe2Relax is a free app that can instruct you on    some simple relaxtion and breathing techniques. Http://Dawnbuse.com is a    free website that provides teaching videos on relaxation.  Also, there are  many apps that   can be downloaded for "mindful" relaxation.  An app called YOGA NIDRA will help walk you through mindfulness. Another app called Calm can be downloaded to give you a structured mindfulness guide with daily reminders and skill development. Headspace for guided meditation Mindfulness Based Stress Reduction Online Course: www.palousemindfulness.com Cold compresses.   5. Don't wait!! Take the maximum allowable dosage of prescribed medication at the first sign of migraine.   6. Compliance:  Take prescribed medication regularly as directed and at the first sign of a migraine.   7. Communicate:  Call your physician when problems arise, especially if your headaches change, increase in frequency/severity, or become associated with neurological symptoms (weakness, numbness, slurred speech, etc.). Proceed to emergency room if you experience new or worsening symptoms or  symptoms do not resolve, if you have new neurologic symptoms or if headache is severe, or for any concerning symptom.   8. Headache/pain management therapies: Consider various complementary methods, including medication, behavioral therapy, psychological counselling, biofeedback, massage therapy, acupuncture, dry needling, and other modalities.  Such measures may reduce the need for medications. Counseling for pain management, where patients learn to function and ignore/minimize their pain, seems to work very well.   9. Recommend changing family's attention and focus away from patient's headaches. Instead, emphasize daily activities. If first question of day is 'How are your headaches/Do you have a headache today?', then patient will constantly think about headaches, thus making them worse. Goal is to re-direct attention away from headaches, toward daily activities and other distractions.   10. Helpful Websites: www.AmericanHeadacheSociety.org patenthood.ch www.headaches.org tightmarket.nl www.achenet.org

## 2023-02-26 NOTE — Progress Notes (Signed)
 PATIENT: Katie Roberts DOB: 14-Jan-2001  REASON FOR VISIT: follow up HISTORY FROM: patient  Virtual Visit via Telephone Note  I connected with Raguel SHAUNNA Irving on 02/27/23 at 10:30 AM EST by telephone and verified that I am speaking with the correct person using two identifiers.   I discussed the limitations, risks, security and privacy concerns of performing an evaluation and management service by telephone and the availability of in person appointments. I also discussed with the patient that there may be a patient responsible charge related to this service. The patient expressed understanding and agreed to proceed.   History of Present Illness:  02/27/23 ALL: Katie Roberts is a 23 y.o. female here today for follow up for migraines. She was last seen by [name] 12/2021 and doing well on Ajovy  and sumatriptan . Since, she reports doing well. She may have 1-2 migrainous headaches a month. Sumatriptan  works well. She can't remember the last time she needed to refill. She is currently in school in [place]. No concerns, today.   History (copied from Dr Sharion previous note)  01/14/2022 (Mychart): No side effects to Ajovy . Extremely improved on Ajovy . She has not woken up or gone to bed with a headache. And the ones she has are mild. imitres works. Continue current regimen, discussed nurtec/ubrelvy but imitrex  works   Reviewed images and agree: (additional 10 minutes to appointment time) CTA NECK FINDINGS   Aortic arch: Standard branching. Imaged portion shows no evidence of aneurysm or dissection. No significant stenosis of the major arch vessel origins.   Right carotid system: Detail limited by mild motion. Right carotid system appears normal without stenosis or dissection.   Left carotid system: Detail limited by motion. Normal left carotid without stenosis or dissection.   Vertebral arteries: Both vertebral arteries are patent. Detail limited by motion.   Skeleton: Negative    Other neck: Negative for mass or adenopathy in the neck.   Upper chest: Lung apices clear bilaterally.   Review of the MIP images confirms the above findings   CTA HEAD FINDINGS   Anterior circulation: Internal carotid artery normally through the skull base and cavernous segment. Anterior and middle cerebral arteries normal bilaterally   Posterior circulation: Both vertebral arteries patent to the basilar. PICA patent bilaterally. Basilar widely patent. Superior cerebellar and posterior cerebral arteries patent bilaterally.   Venous sinuses: Normal venous enhancement   Anatomic variants: None   Review of the MIP images confirms the above findings   IMPRESSION: 1. Image quality degraded by patient motion especially in the neck component of the study. 2. Negative for carotid dissection. Normal carotid and vertebral arteries, allowing for motion 3. Normal CT angio head 4. Normal CT head    Patient complains of symptoms per HPI as well as the following symptoms: migraine . Pertinent negatives and positives per HPI. All others negative   HPI 09/11/2021:  Katie Roberts is a 23 y.o. female here as requested by [name] found for migraines.  She has a past medical history of anxiety, migraines, TMJ pain, sternocleidomastoid muscle tenderness, mixed obsessional thoughts and acts, low ferritin, hair loss, generalized abdominal pain, fatigue, bone marrow donor, B12 deficiency, ADHD, adjustment disorder with anxious mood.  I reviewed notes from Darice ferns who also mentioned OCD, anxiety, depression.  She has a therapist and a psychiatrist that appears.  I reviewed pediatric neurology's notes Dr. Norwood Abu who she has been seeing for headaches and migraines.  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  Neurologic exam which was extensive was normal.  Last they tried amitriptyline  replacing propranolol , continued her on magnesium and vitamin B2, it does not appear as though brain imaging was completed.  It [facility], saw Waddell Ada, PA, noted she was getting lots of migraines because of exams, sumatriptan  not helping, associated dizziness and nausea, pressure around the ears, photophobic, a Toradol shot was prescribed.   Patient is here and reports that her headaches have been ongoing for many years, mostly right-sided, with tbut around the eye area, photophobia, phonophobia, pulsating pounding throbbing, hurts to move, no aura, nausea no vomiting. She has at least 8 migraine days a month, daily headaches ongoing for > 1 year (started getting headaches since age of 11) radiates to the right side of the neck which is new and worsening (points to the right carotid artery), in the last year radiates to the right side of the neck, worsened since January. At least 24 hours and overnight and worse in the mornings supine, gets blurry vision, can wake with headaches/migraines, sleep and stress can be a trigger, they can be moderate to severe and interfering with life. We spoke about aura and vertigo. She has vertigo and dizziness. No other focal neurologic deficits, associated symptoms, inciting events or modifiable factors.   Reviewed notes, labs and imaging from outside physicians, which showed:   From a thorough review of records, medications tried that can be used in migraine management include: Propranolol , SSRIs, amitriptyline , Flexeril , Lexapro , Prozac , Medrol  Dosepak, riboflavin B2, Zoloft ,  sumatriptan (helps),maxalt, topiramate contraindicated due to depression.    Observations/Objective:  Generalized: Well developed, in no acute distress  Mentation: Alert oriented to time, place, history taking. Follows all commands speech and language fluent   Assessment and Plan:  23 y.o. year old female  has a past medical history of Anxiety and Migraine. here with    ICD-10-CM   1. Chronic migraine without aura without status migrainosus, not intractable  G43.709       Cimberly is doing well. Migraines well managed on Ajovy  and sumatriptan . She was advised to continue current treatment plan. Healthy lifestyle habits encouraged. She initially requested follow up visit with [name] but agrees to schedule with me if doing well. We can schedule with [name] if she wishes. Annual follow up advised.   No orders of the defined types were placed in this encounter.   Meds ordered this encounter  Medications   Fremanezumab -vfrm (AJOVY ) 225 MG/1.5ML SOAJ    Sig: Inject 225 mg into the skin every 30 (thirty) days.    Dispense:  4.5 mL    Refill:  3    Supervising Provider:   AHERN, ANTONIA B [[id number]]   SUMAtriptan  (IMITREX ) 100 MG tablet    Sig: May repeat in 2 hours if headache persists or recurs.    Dispense:  9 tablet    Refill:  11    Supervising Provider:   INES ONETHA NOVAK [[id number]]     Follow Up Instructions:  I discussed the assessment and treatment plan with the patient. The patient was provided an opportunity to ask questions and all were answered. The patient agreed with the plan and demonstrated an understanding of the instructions.   The patient was advised to call back or seek an in-person evaluation if the symptoms worsen or if the condition fails to improve as anticipated.  I provided 15 minutes of non-face-to-face time during this encounter. Patient located at their place of residence during Mychart visit. Provider is in the office.    Dezire Turk, NP

## 2023-02-27 ENCOUNTER — Encounter: Payer: Self-pay | Admitting: Family Medicine

## 2023-02-27 ENCOUNTER — Telehealth (INDEPENDENT_AMBULATORY_CARE_PROVIDER_SITE_OTHER): Payer: 59 | Admitting: Family Medicine

## 2023-02-27 DIAGNOSIS — G43709 Chronic migraine without aura, not intractable, without status migrainosus: Secondary | ICD-10-CM

## 2023-02-27 MED ORDER — AJOVY 225 MG/1.5ML ~~LOC~~ SOAJ: 225.0000 mg | SUBCUTANEOUS | 3 refills | Status: DC

## 2023-02-27 MED ORDER — SUMATRIPTAN SUCCINATE 100 MG PO TABS: ORAL_TABLET | ORAL | 11 refills | Status: DC

## 2023-04-08 ENCOUNTER — Encounter: Payer: Self-pay | Admitting: Family

## 2023-04-13 ENCOUNTER — Ambulatory Visit (HOSPITAL_COMMUNITY)
Admission: EM | Admit: 2023-04-13 | Discharge: 2023-04-14 | Disposition: A | Discharge status: Home / Self Care | Payer: 59 | Attending: Urology | Admitting: Urology

## 2023-04-13 DIAGNOSIS — F411 Generalized anxiety disorder: Secondary | ICD-10-CM

## 2023-04-14 DIAGNOSIS — F411 Generalized anxiety disorder: Secondary | ICD-10-CM

## 2023-04-14 MED ORDER — HYDROXYZINE HCL 25 MG PO TABS: 25.0000 mg | ORAL_TABLET | Freq: Two times a day (BID) | ORAL | 0 refills | Status: DC | PRN

## 2023-04-14 NOTE — ED Provider Notes (Signed)
 Behavioral Health Urgent Care Medical Screening Exam  Patient Name: Katie Roberts MRN: [id number] Date of Evaluation: 04/14/23 Chief Complaint:   Diagnosis:  Final diagnoses:  GAD (generalized anxiety disorder)    History of Present illness: Katie Roberts is a 23 y.o. female who reports feeling stressed with school and work. She shares that she stopped taking her prescribed medication, Buspirone 5mg /BID, for the past three weeks, during which she began experiencing increased anxiety and obsessive-compulsive disorder (OCD) symptoms. She restarted Buspirone at 10mg /BID as advised by her doctor but reports no improvement. She reports having a panic attack on Thursday, triggered by the sound of a balloon caught in a ceiling fan while she was sleeping. She mentions feeling more depressed and lacking motivation to attend her classes. She is currently not engaged in outpatient therapy. She denies suicidal ideation, homicidal ideation, or auditory/visual hallucinations. She acknowledges being a social drinker and had one glass of wine last night.  Patient is alert and oriented x4. She is well-groomed. Her mood is described as anxious with a congruent affect. Speech is normal in rate and tone, and thought processes are logical and goal-directed. There is no evidence of delusional thinking or hallucinations. She denies suicidal and homicidal ideation. Insight into her symptoms appears fair. Her judgment is somewhat impaired  Patient encouraged to establish outpatient service with a therapist. Outpatient resources provided. Patient encouraged to continue taking prescription medication as directed by her provider. This provider discussed adding Hydroxyzine 25mg  2 times per day prn for anxiety, patient is agreeable.   Prescription for Hydroxyzine 25mg  2 time/day PRN X 14 tablets given.   Flowsheet Row ED from 04/13/2023 in [facility] ED from 09/13/2022 in [facility] Emergency  Department at [facility]  C-SSRS RISK CATEGORY No Risk No Risk       Psychiatric Specialty Exam  Presentation  General Appearance:Appropriate for Environment  Eye Contact:Good  Speech:Clear and Coherent  Speech Volume:Normal  Handedness:Right   Mood and Affect  Mood: Anxious  Affect: Appropriate   Thought Process  Thought Processes: Coherent  Descriptions of Associations:Intact  Orientation:Full (Time, Place and Person)  Thought Content:WDL    Hallucinations:None  Ideas of Reference:None  Suicidal Thoughts:No  Homicidal Thoughts:No   Sensorium  Memory: Immediate Good; Recent Good; Remote Good  Judgment: Good  Insight: Good   Executive Functions  Concentration: Good  Attention Span: Good  Recall: Good  Fund of Knowledge: Good  Language: Good   Psychomotor Activity  Psychomotor Activity: Normal   Assets  Assets: Communication Skills; Desire for Improvement; Social Support; Housing; Physical Health; Transportation   Sleep  Sleep: Good  Number of hours:  10   Physical Exam: Physical Exam Vitals and nursing note reviewed.  Constitutional:      General: She is not in acute distress.    Appearance: She is well-developed.  HENT:     Head: Normocephalic and atraumatic.  Eyes:     Conjunctiva/sclera: Conjunctivae normal.  Cardiovascular:     Rate and Rhythm: Normal rate.  Pulmonary:     Effort: Pulmonary effort is normal.  Abdominal:     Palpations: Abdomen is soft.  Musculoskeletal:        General: Normal range of motion.     Cervical back: Normal range of motion.  Skin:    General: Skin is warm and dry.     Capillary Refill: Capillary refill takes less than 2 seconds.  Neurological:     Mental Status: She  is alert and oriented to person, place, and time.  Psychiatric:        Attention and Perception: Attention normal.        Mood and Affect: Mood is anxious.        Speech: Speech normal.         Behavior: Behavior normal. Behavior is cooperative.        Thought Content: Thought content normal.    Review of Systems  Constitutional: Negative.   HENT: Negative.    Eyes: Negative.   Respiratory: Negative.    Cardiovascular: Negative.   Gastrointestinal: Negative.   Genitourinary: Negative.   Musculoskeletal: Negative.   Skin: Negative.   Neurological: Negative.   Endo/Heme/Allergies: Negative.   Psychiatric/Behavioral:  The patient is nervous/anxious.    Blood pressure (!) 131/99, pulse 81, temperature 98.4 F (36.9 C), temperature source Oral, resp. rate 20, SpO2 100%. There is no height or weight on file to calculate BMI.  Musculoskeletal: Strength & Muscle Tone: within normal limits Gait & Station: normal Patient leans: Right   [facility] MSE Discharge Disposition for Follow up and Recommendations: Based on my evaluation the patient does not appear to have an emergency medical condition and can be discharged with resources and follow up care in outpatient services for Medication Management and Individual Therapy  Patient encouraged to establish outpatient service with a therapist. Outpatient resources provided. Patient encouraged to continue taking prescription medication as directed by her provider. This provider discussed adding Hydroxyzine 25mg  2 times per day prn for anxiety, patient is agreeable.   Prescription for Hydroxyzine 25mg  2 time/day PRN X 14 tablets given.    Maricela Bo, NP 04/14/2023, 12:45 AM

## 2023-04-14 NOTE — Progress Notes (Signed)
   04/13/23 2133  [facility] Triage Screening (Walk-ins at [facility] only)  What Is the Reason for Your Visit/Call Today? Pt is a 23 yo female who presents to [facility] voluntarily accompanied by her mother. Pt reports that she is currently struggling with severe anxiety and OCD. Pt reports that for the past three weeks she stopped taking her medication ( Buspirone) . However, this past week she started back and increased her dosage to 10 mg twice a day and started to notice that she felt different on Wednesday. Pt reports that she began to feel depressed and lack motivation to attend her classes. Pt reports that she also experience a panic attack. Pt reports that she is currently not active with outpatient therapy sessions. Pt denies SI, HI, and AVH. Pt reports that she is a social drinker and recently had one glass of wine last night.  How Long Has This Been Causing You Problems? 1-6 months  Have You Recently Had Any Thoughts About Hurting Yourself? No  Are You Planning to Commit Suicide/Harm Yourself At This time? No  Have you Recently Had Thoughts About Hurting Someone Karolee Ohs? No  Are You Planning To Harm Someone At This Time? No  Physical Abuse Denies  Verbal Abuse Denies  Sexual Abuse Denies  Exploitation of patient/patient's resources Denies  Self-Neglect Denies  Possible abuse reported to:  (n/a)  Are you currently experiencing any auditory, visual or other hallucinations? No  Have You Used Any Alcohol or Drugs in the Past 24 Hours? Yes  What Did You Use and How Much? Glass of wine last night  Do you have any current medical co-morbidities that require immediate attention? No  Clinician description of patient physical appearance/behavior: Pt was tearful but cooperative  What Do You Feel Would Help You the Most Today? Treatment for Depression or other mood problem;Stress Management;Medication(s)  If access to [facility] was not available, would you have sought care in the Emergency Department? Yes   Determination of Need Routine (7 days)  Options For Referral Medication Management;Outpatient Therapy    Flowsheet Row ED from 04/13/2023 in [facility] ED from 09/13/2022 in [facility] Emergency Department at [facility]  C-SSRS RISK CATEGORY No Risk No Risk

## 2023-04-14 NOTE — Discharge Instructions (Addendum)

## 2023-04-17 ENCOUNTER — Encounter: Payer: Self-pay | Admitting: Family

## 2023-05-05 ENCOUNTER — Encounter: Payer: Self-pay | Admitting: Family Medicine

## 2023-05-07 MED ORDER — AJOVY 225 MG/1.5ML ~~LOC~~ SOAJ: 225.0000 mg | SUBCUTANEOUS | 3 refills | Status: DC

## 2023-05-07 NOTE — Addendum Note (Signed)
 Addended by: Raynald Kemp A on: 05/07/2023 03:02 PM   Modules accepted: Orders

## 2023-06-20 ENCOUNTER — Encounter: Payer: Self-pay | Admitting: Family

## 2023-10-03 ENCOUNTER — Telehealth (INDEPENDENT_AMBULATORY_CARE_PROVIDER_SITE_OTHER): Payer: Self-pay | Admitting: Otolaryngology

## 2023-10-03 NOTE — Telephone Encounter (Signed)
 Patient called in to schedule appointment and to see if there was an ENT in the [place] area that she can be referred to.  Please advise.

## 2023-10-08 ENCOUNTER — Other Ambulatory Visit (HOSPITAL_COMMUNITY): Payer: Self-pay

## 2023-10-14 ENCOUNTER — Telehealth: Payer: Self-pay

## 2023-10-14 ENCOUNTER — Other Ambulatory Visit (HOSPITAL_COMMUNITY): Payer: Self-pay

## 2023-10-14 NOTE — Telephone Encounter (Signed)
 Pharmacy Patient Advocate Encounter  Received notification from [facility] that Prior Authorization for Ajovy  has been APPROVED from 10/14/2023 to 10/13/2024. Unable to obtain price due to refill too soon rejection, last fill date 08/12/2023 next available fill date9/08/2023   PA #/Case ID/Reference #: [id number]

## 2023-10-14 NOTE — Telephone Encounter (Signed)
 Pharmacy Patient Advocate Encounter   Received notification from Fax that prior authorization for Ajovy  225mg /ml auto-injector is required/requested.   Insurance verification completed.   The patient is insured through [facility] .   Per test claim: PA required; PA submitted to above mentioned insurance via Latent Key/confirmation #/EOC [id number] Status is pending

## 2023-11-07 ENCOUNTER — Ambulatory Visit (INDEPENDENT_AMBULATORY_CARE_PROVIDER_SITE_OTHER): Payer: Self-pay | Admitting: Otolaryngology

## 2023-11-07 ENCOUNTER — Encounter (INDEPENDENT_AMBULATORY_CARE_PROVIDER_SITE_OTHER): Payer: Self-pay | Admitting: Otolaryngology

## 2023-11-07 VITALS — BP 129/88 | HR 84

## 2023-11-07 DIAGNOSIS — J343 Hypertrophy of nasal turbinates: Secondary | ICD-10-CM

## 2023-11-07 DIAGNOSIS — R0981 Nasal congestion: Secondary | ICD-10-CM | POA: Diagnosis not present

## 2023-11-07 DIAGNOSIS — J310 Chronic rhinitis: Secondary | ICD-10-CM

## 2023-11-07 DIAGNOSIS — R42 Dizziness and giddiness: Secondary | ICD-10-CM | POA: Diagnosis not present

## 2023-11-07 NOTE — Progress Notes (Signed)
 Patient ID: Katie Roberts, female   DOB: [birth year], 23 y.o.   MRN: [id number]  Follow-up: Recurrent dizziness, chronic rhinitis  HPI:  The patient is a 23 year old female who returns today complaining of recurrent dizziness.  The patient was last seen in July 2023.  At that time, she was noted to have recurrent dizziness, secondary to unilateral right ear peripheral vestibular dysfunction.  She was treated with vestibular rehab.  The patient also has a history of chronic nasal congestion, secondary to nasal mucosal congestion and bilateral inferior turbinate hypertrophy.  The patient was treated with immunotherapy, in addition to allergy medications.  The patient returns today reporting that she was doing well until this summer, when she started experiencing more recurrent dizziness.  She has been experiencing intermittent spinning vertigo the last 4 hours.  She denies any hearing difficulty or tinnitus.  Her allergy symptoms have improved with the allergy shots.  Currently she denies any otalgia, otorrhea, facial pain, or fever.  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  Exam:  General: Communicates without difficulty, well nourished, no acute distress. Head: Normocephalic, no evidence injury, no tenderness, facial buttresses intact without stepoff. Face/sinus: No tenderness to palpation and percussion. Facial movement is normal and symmetric. Eyes: PERRL, EOMI. No scleral icterus, conjunctivae clear. Neuro: CN II exam reveals vision grossly intact.  No nystagmus at any point of gaze. Ears: Auricles well formed without lesions.  Ear canals are intact without mass or lesion.  No erythema or edema is appreciated.  The TMs are intact without fluid. Nose: External evaluation reveals normal support and skin without lesions.  Dorsum is intact.  Anterior rhinoscopy reveals congested mucosa over anterior aspect of inferior turbinates and intact septum.  No  purulence noted. Oral:  Oral cavity and oropharynx are intact, symmetric, without erythema or edema.  Mucosa is moist without lesions. Neck: Full range of motion without pain.  There is no significant lymphadenopathy.  No masses palpable.  Thyroid bed within normal limits to palpation.  Parotid glands and submandibular glands equal bilaterally without mass.  Trachea is midline. Neuro:  CN 2-12 grossly intact. Gait normal. Vestibular: No nystagmus at any point of gaze. Dix Hallpike negative. Vestibular: There is no nystagmus with pneumatic pressure on either tympanic membrane or Valsalva. The cerebellar examination is unremarkable.   Assessment: 1.  Recurrent dizziness, secondary to unilateral right ear peripheral vestibular dysfunction.   2.  Her ear canals, tympanic membranes, and middle ear spaces are normal. 3.  Chronic rhinitis with nasal mucosal congestion and bilateral inferior turbinate hypertrophy.  Her nasal symptoms have improved with the allergy shots.  Plan: 1.  The physical exam findings are reviewed with the patient and her father. 2.  The pathophysiology of dizziness and vestibular dysfunction are extensively reviewed with the patient. Questions are invited and answered.  3.  The patient should continue with her current allergy treatment regimen and immunotherapy.  4.  The patient will likely benefit from restarting her vestibular rehabilitation to improve the balancing function.  A physical therapy referral to [facility] [place] will be arranged soon as possible. 5.  The patient is encouraged to call with any questions or concerns.

## 2023-11-09 DIAGNOSIS — R42 Dizziness and giddiness: Secondary | ICD-10-CM | POA: Insufficient documentation

## 2023-11-09 DIAGNOSIS — J343 Hypertrophy of nasal turbinates: Secondary | ICD-10-CM | POA: Insufficient documentation

## 2023-11-09 DIAGNOSIS — J310 Chronic rhinitis: Secondary | ICD-10-CM | POA: Insufficient documentation

## 2024-02-21 ENCOUNTER — Other Ambulatory Visit: Payer: Self-pay | Admitting: Family

## 2024-03-01 NOTE — Progress Notes (Unsigned)
 "  PATIENT: Katie Roberts DOB: Mar 29, 2000  REASON FOR VISIT: follow up HISTORY FROM: patient  Virtual Visit via Mychart Video Note  I connected with Raguel SHAUNNA Irving on 03/01/2024 at  8:00 AM EST by video and verified that I am speaking with the correct person using two identifiers.   I discussed the limitations, risks, security and privacy concerns of performing an evaluation and management service by video and the availability of in person appointments. I also discussed with the patient that there may be a patient responsible charge related to this service. The patient expressed understanding and agreed to proceed.   History of Present Illness:  03/01/2024 ALL (Mychart): [name] returns for follow up for migraines. Katie Roberts continues Ajovy  and sumatriptan .   02/27/2023 ALL (Mychart):  TERRENCE PIZANA is a 24 y.o. female here today for follow up for migraines. Katie Roberts was last seen by [name] 12/2021 and doing well on Ajovy  and sumatriptan . Since, Katie Roberts reports doing well. Katie Roberts may have 1-2 migrainous headaches a month. Sumatriptan  works well. Katie Roberts can't remember the last time Katie Roberts needed to refill. Katie Roberts is currently in school in [place]. No concerns, today.   History (copied from Dr Sharion previous note)  01/14/2022 (Mychart): No side effects to Ajovy . Extremely improved on Ajovy . Katie Roberts has not woken up or gone to bed with a headache. And the ones Katie Roberts has are mild. imitres works. Continue current regimen, discussed nurtec/ubrelvy but imitrex  works   Reviewed images and agree: (additional 10 minutes to appointment time) CTA NECK FINDINGS   Aortic arch: Standard branching. Imaged portion shows no evidence of aneurysm or dissection. No significant stenosis of the major arch vessel origins.   Right carotid system: Detail limited by mild motion. Right carotid system appears normal without stenosis or dissection.   Left carotid system: Detail limited by motion. Normal left carotid without stenosis or  dissection.   Vertebral arteries: Both vertebral arteries are patent. Detail limited by motion.   Skeleton: Negative   Other neck: Negative for mass or adenopathy in the neck.   Upper chest: Lung apices clear bilaterally.   Review of the MIP images confirms the above findings   CTA HEAD FINDINGS   Anterior circulation: Internal carotid artery normally through the skull base and cavernous segment. Anterior and middle cerebral arteries normal bilaterally   Posterior circulation: Both vertebral arteries patent to the basilar. PICA patent bilaterally. Basilar widely patent. Superior cerebellar and posterior cerebral arteries patent bilaterally.   Venous sinuses: Normal venous enhancement   Anatomic variants: None   Review of the MIP images confirms the above findings   IMPRESSION: 1. Image quality degraded by patient motion especially in the neck component of the study. 2. Negative for carotid dissection. Normal carotid and vertebral arteries, allowing for motion 3. Normal CT angio head 4. Normal CT head    Patient complains of symptoms per HPI as well as the following symptoms: migraine . Pertinent negatives and positives per HPI. All others negative   HPI 09/11/2021:  DRISANA SCHWEICKERT is a 24 y.o. female here as requested by [name] found for migraines.  Katie Roberts has a past medical history of anxiety, migraines, TMJ pain, sternocleidomastoid muscle tenderness, mixed obsessional thoughts and acts, low ferritin, hair loss, generalized abdominal pain, fatigue, bone marrow donor, B12 deficiency, ADHD, adjustment disorder with anxious mood.  I reviewed notes from Darice ferns who also mentioned OCD, anxiety, depression.  Katie Roberts has a therapist and a psychiatrist that appears.  I reviewed pediatric neurology's notes Dr. Norwood Abu who Katie Roberts has been seeing for headaches and migraines.  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  Neurologic exam which was extensive was normal.  Last they tried amitriptyline  replacing propranolol , continued her on magnesium and vitamin B2, it does not appear as though brain imaging was completed.  It appears [facility], saw Waddell Ada, PA, noted Katie Roberts was getting lots of migraines because of exams, sumatriptan  not helping, associated dizziness and nausea, pressure around the ears, photophobic, a Toradol shot was prescribed.   Patient is here and reports that her headaches have been ongoing for many years, mostly right-sided, with tbut around the eye area, photophobia, phonophobia, pulsating pounding throbbing, hurts to move, no aura, nausea no vomiting. Katie Roberts has at least 8 migraine days a month, daily headaches ongoing for > 1 year (started getting headaches since age of 89) radiates to the right side of the neck which is new and worsening (points to the right carotid artery), in the last year radiates to the right side of the neck, worsened since January. At least 24 hours and overnight and worse in the mornings supine, gets blurry vision, can wake with headaches/migraines, sleep and stress can be a trigger, they can be moderate to severe and interfering with life. We spoke about aura and vertigo. Katie Roberts has vertigo and dizziness. No other focal neurologic deficits, associated symptoms, inciting events or modifiable factors.   Reviewed notes, labs and imaging from outside physicians, which showed:   From a thorough review of records, medications tried that can be used in migraine management  include: Propranolol , SSRIs, amitriptyline , Flexeril , Lexapro , Prozac , Medrol  Dosepak, riboflavin B2, Zoloft , sumatriptan (helps),maxalt, topiramate contraindicated due to depression.    Observations/Objective:  Generalized: Well developed, in no acute distress  Mentation: Alert oriented to time, place, history taking. Follows all commands speech and language fluent   Assessment and Plan:  24 y.o. year old female  has a past medical history of Anxiety and Migraine. here with  No diagnosis found.   Hilaria is doing well. Migraines well managed on Ajovy  and sumatriptan . Katie Roberts was advised to continue current treatment plan. Healthy lifestyle habits encouraged. Katie Roberts initially requested follow up visit with [name] but agrees to schedule with me if doing well. We can schedule with [name] if Katie Roberts wishes. Annual follow up advised.   No orders of the defined types were placed in this encounter.   No orders of the defined types were placed in this encounter.    Follow Up Instructions:  I discussed the assessment and treatment plan with the patient. The patient was provided an opportunity to ask questions and all were answered. The patient agreed with the plan and demonstrated an understanding of the instructions.   The patient was advised to call back or seek an in-person evaluation if the symptoms worsen or if the condition fails to improve as anticipated.  I provided 15 minutes of non-face-to-face time during this encounter. Patient located at their place of residence during  Mychart visit. Provider is in the office.    Kimmerly Lora, NP  "

## 2024-03-01 NOTE — Patient Instructions (Incomplete)
 Below is our plan:  We will ***  Please make sure you are staying well hydrated. I recommend 50-60 ounces daily. Well balanced diet and regular exercise encouraged. Consistent sleep schedule with 6-8 hours recommended.   Please continue follow up with care team as directed.   Follow up with me in 1 year   You may receive a survey regarding today's visit. I encourage you to leave honest feed back as I do use this information to improve patient care. Thank you for seeing me today!   GENERAL HEADACHE INFORMATION:   Natural supplements: Magnesium Oxide or Magnesium Glycinate 500 mg at bed (up to 800 mg daily) Coenzyme Q10 300 mg in AM Vitamin B2- 200 mg twice a day   Add 1 supplement at a time since even natural supplements can have undesirable side effects. You can sometimes buy supplements cheaper (especially Coenzyme Q10) at www.webmailguide.co.za or at [facility].  Migraine with aura: There is increased risk for stroke in women with migraine with aura and a contraindication for the combined contraceptive pill for use by women who have migraine with aura. The risk for women with migraine without aura is lower. However other risk factors like smoking are far more likely to increase stroke risk than migraine. There is a recommendation for no smoking and for the use of OCPs without estrogen such as progestogen only pills particularly for women with migraine with aura.SABRA People who have migraine headaches with auras may be 3 times more likely to have a stroke caused by a blood clot, compared to migraine patients who don't see auras. Women who take hormone-replacement therapy may be 30 percent more likely to suffer a clot-based stroke than women not taking medication containing estrogen. Other risk factors like smoking and high blood pressure may be  much more important.    Vitamins and herbs that show potential:   Magnesium: Magnesium (250 mg twice a day or 500 mg at bed) has a relaxant effect on smooth  muscles such as blood vessels. Individuals suffering from frequent or daily headache usually have low magnesium levels which can be increase with daily supplementation of 400-750 mg. Three trials found 40-90% average headache reduction  when used as a preventative. Magnesium may help with headaches are aura, the best evidence for magnesium is for migraine with aura is its thought to stop the cortical spreading depression we believe is the pathophysiology of migraine aura.Magnesium also demonstrated the benefit in menstrually related migraine.  Magnesium is part of the messenger system in the serotonin cascade and it is a good muscle relaxant.  It is also useful for constipation which can be a side effect of other medications used to treat migraine. Good sources include nuts, whole grains, and tomatoes. Side Effects: loose stool/diarrhea  Riboflavin (vitamin B 2) 200 mg twice a day. This vitamin assists nerve cells in the production of ATP a principal energy storing molecule.  It is necessary for many chemical reactions in the body.  There have been at least 3 clinical trials of riboflavin using 400 mg per day all of which suggested that migraine frequency can be decreased.  All 3 trials showed significant improvement in over half of migraine sufferers.  The supplement is found in bread, cereal, milk, meat, and poultry.  Most Americans get more riboflavin than the recommended daily allowance, however riboflavin deficiency is not necessary for the supplements to help prevent headache. Side effects: energizing, green urine   Coenzyme Q10: This is present in almost all  cells in the body and is critical component for the conversion of energy.  Recent studies have shown that a nutritional supplement of CoQ10 can reduce the frequency of migraine attacks by improving the energy production of cells as with riboflavin.  Doses of 150 mg twice a day have been shown to be effective.   Melatonin: Increasing evidence shows  correlation between melatonin secretion and headache conditions.  Melatonin supplementation has decreased headache intensity and duration.  It is widely used as a sleep aid.  Sleep is natures way of dealing with migraine.  A dose of 3 mg is recommended to start for headaches including cluster headache. Higher doses up to 15 mg has been reviewed for use in Cluster headache and have been used. The rationale behind using melatonin for cluster is that many theories regarding the cause of Cluster headache center around the disruption of the normal circadian rhythm in the brain.  This helps restore the normal circadian rhythm.   HEADACHE DIET: Foods and beverages which may trigger migraine Note that only 20% of headache patients are food sensitive. You will know if you are food sensitive if you get a headache consistently 20 minutes to 2 hours after eating a certain food. Only cut out a food if it causes headaches, otherwise you might remove foods you enjoy! What matters most for diet is to eat a well balanced healthy diet full of vegetables and low fat protein, and to not miss meals.   Chocolate, other sweets ALL cheeses except cottage and cream cheese Dairy products, yogurt, sour cream, ice cream Liver Meat extracts (Bovril, Marmite, meat tenderizers) Meats or fish which have undergone aging, fermenting, pickling or smoking. These include: Hotdogs,salami,Lox,sausage, mortadellas,smoked salmon, pepperoni, Pickled herring Pods of broad bean (English beans, Chinese pea pods, Italian (fava) beans, lima and navy beans Ripe avocado, ripe banana Yeast extracts or active yeast preparations such as Brewer's or Fleishman's (commercial bakes goods are permitted) Tomato based foods, pizza (lasagna, etc.)   MSG (monosodium glutamate) is disguised as many things; look for these common aliases: Monopotassium glutamate Autolysed yeast Hydrolysed protein Sodium caseinate flavorings all natural  preservatives Nutrasweet   Avoid all other foods that convincingly provoke headaches.   Resources: The Dizzy Bluford Aid Your Headache Diet, migrainestrong.com  https://zamora-andrews.com/   Caffeine and Migraine For patients that have migraine, caffeine intake more than 3 days per week can lead to dependency and increased migraine frequency. I would recommend cutting back on your caffeine intake as best you can. The recommended amount of caffeine is 200-300 mg daily, although migraine patients may experience dependency at even lower doses. While you may notice an increase in headache temporarily, cutting back will be helpful for headaches in the long run. For more information on caffeine and migraine, visit: https://americanmigrainefoundation.org/resource-library/caffeine-and-migraine/   Headache Prevention Strategies:   1. Maintain a headache diary; learn to identify and avoid triggers.  - This can be a simple note where you log when you had a headache, associated symptoms, and medications used - There are several smartphone apps developed to help track migraines: Migraine Buddy, Migraine Monitor, Curelator N1-Headache App   Common triggers include: Emotional triggers: Emotional/Upset family or friends Emotional/Upset occupation Business reversal/success Anticipation anxiety Crisis-serious Post-crisis periodNew job/position   Physical triggers: Vacation Day Weekend Strenuous Exercise High Altitude Location New Move Menstrual Day Physical Illness Oversleep/Not enough sleep Weather changes Light: Photophobia or light sesnitivity treatment involves a balance between desensitization and reduction in overly strong input. Use dark polarized glasses  outside, but not inside. Avoid bright or fluorescent light, but do not dim environment to the point that going into a normally lit room hurts. Consider FL-41 tint lenses, which reduce the most  irritating wavelengths without blocking too much light.  These can be obtained at axonoptics.com or theraspecs.com Foods: see list above.   2. Limit use of acute treatments (over-the-counter medications, triptans, etc.) to no more than 2 days per week or 10 days per month to prevent medication overuse headache (rebound headache).     3. Follow a regular schedule (including weekends and holidays): Don't skip meals. Eat a balanced diet. 8 hours of sleep nightly. Minimize stress. Exercise 30 minutes per day. Being overweight is associated with a 5 times increased risk of chronic migraine. Keep well hydrated and drink 6-8 glasses of water per day.   4. Initiate non-pharmacologic measures at the earliest onset of your headache. Rest and quiet environment. Relax and reduce stress. Breathe2Relax is a free app that can instruct you on    some simple relaxtion and breathing techniques. Http://Dawnbuse.com is a    free website that provides teaching videos on relaxation.  Also, there are  many apps that   can be downloaded for mindful relaxation.  An app called YOGA NIDRA will help walk you through mindfulness. Another app called Calm can be downloaded to give you a structured mindfulness guide with daily reminders and skill development. Headspace for guided meditation Mindfulness Based Stress Reduction Online Course: www.palousemindfulness.com Cold compresses.   5. Don't wait!! Take the maximum allowable dosage of prescribed medication at the first sign of migraine.   6. Compliance:  Take prescribed medication regularly as directed and at the first sign of a migraine.   7. Communicate:  Call your physician when problems arise, especially if your headaches change, increase in frequency/severity, or become associated with neurological symptoms (weakness, numbness, slurred speech, etc.). Proceed to emergency room if you experience new or worsening symptoms or symptoms do not resolve, if you have new  neurologic symptoms or if headache is severe, or for any concerning symptom.   8. Headache/pain management therapies: Consider various complementary methods, including medication, behavioral therapy, psychological counselling, biofeedback, massage therapy, acupuncture, dry needling, and other modalities.  Such measures may reduce the need for medications. Counseling for pain management, where patients learn to function and ignore/minimize their pain, seems to work very well.   9. Recommend changing family's attention and focus away from patient's headaches. Instead, emphasize daily activities. If first question of day is 'How are your headaches/Do you have a headache today?', then patient will constantly think about headaches, thus making them worse. Goal is to re-direct attention away from headaches, toward daily activities and other distractions.   10. Helpful Websites: www.AmericanHeadacheSociety.org patenthood.ch www.headaches.org tightmarket.nl www.achenet.org

## 2024-03-02 ENCOUNTER — Telehealth: Payer: 59 | Admitting: Family Medicine

## 2024-03-05 ENCOUNTER — Telehealth (INDEPENDENT_AMBULATORY_CARE_PROVIDER_SITE_OTHER): Admitting: Family Medicine

## 2024-03-05 ENCOUNTER — Encounter: Payer: Self-pay | Admitting: Family Medicine

## 2024-03-05 DIAGNOSIS — G43709 Chronic migraine without aura, not intractable, without status migrainosus: Secondary | ICD-10-CM | POA: Diagnosis not present

## 2024-03-05 MED ORDER — AJOVY 225 MG/1.5ML ~~LOC~~ SOAJ: 225.0000 mg | SUBCUTANEOUS | 3 refills | Status: AC

## 2024-03-05 MED ORDER — SUMATRIPTAN SUCCINATE 100 MG PO TABS: ORAL_TABLET | ORAL | 11 refills | Status: AC

## 2024-03-05 NOTE — Patient Instructions (Signed)
 Below is our plan:  We will continue Ajovy  and sumatriptan   Please make sure you are staying well hydrated. I recommend 50-60 ounces daily. Well balanced diet and regular exercise encouraged. Consistent sleep schedule with 6-8 hours recommended.   Please continue follow up with care team as directed.   Follow up with me in 1 year   You may receive a survey regarding today's visit. I encourage you to leave honest feed back as I do use this information to improve patient care. Thank you for seeing me today!   GENERAL HEADACHE INFORMATION:   Natural supplements: Magnesium Oxide or Magnesium Glycinate 500 mg at bed (up to 800 mg daily) Coenzyme Q10 300 mg in AM Vitamin B2- 200 mg twice a day   Add 1 supplement at a time since even natural supplements can have undesirable side effects. You can sometimes buy supplements cheaper (especially Coenzyme Q10) at www.webmailguide.co.za or at [facility].  Migraine with aura: There is increased risk for stroke in women with migraine with aura and a contraindication for the combined contraceptive pill for use by women who have migraine with aura. The risk for women with migraine without aura is lower. However other risk factors like smoking are far more likely to increase stroke risk than migraine. There is a recommendation for no smoking and for the use of OCPs without estrogen such as progestogen only pills particularly for women with migraine with aura.SABRA People who have migraine headaches with auras may be 3 times more likely to have a stroke caused by a blood clot, compared to migraine patients who don't see auras. Women who take hormone-replacement therapy may be 30 percent more likely to suffer a clot-based stroke than women not taking medication containing estrogen. Other risk factors like smoking and high blood pressure may be  much more important.    Vitamins and herbs that show potential:   Magnesium: Magnesium (250 mg twice a day or 500 mg at bed) has a  relaxant effect on smooth muscles such as blood vessels. Individuals suffering from frequent or daily headache usually have low magnesium levels which can be increase with daily supplementation of 400-750 mg. Three trials found 40-90% average headache reduction  when used as a preventative. Magnesium may help with headaches are aura, the best evidence for magnesium is for migraine with aura is its thought to stop the cortical spreading depression we believe is the pathophysiology of migraine aura.Magnesium also demonstrated the benefit in menstrually related migraine.  Magnesium is part of the messenger system in the serotonin cascade and it is a good muscle relaxant.  It is also useful for constipation which can be a side effect of other medications used to treat migraine. Good sources include nuts, whole grains, and tomatoes. Side Effects: loose stool/diarrhea  Riboflavin (vitamin B 2) 200 mg twice a day. This vitamin assists nerve cells in the production of ATP a principal energy storing molecule.  It is necessary for many chemical reactions in the body.  There have been at least 3 clinical trials of riboflavin using 400 mg per day all of which suggested that migraine frequency can be decreased.  All 3 trials showed significant improvement in over half of migraine sufferers.  The supplement is found in bread, cereal, milk, meat, and poultry.  Most Americans get more riboflavin than the recommended daily allowance, however riboflavin deficiency is not necessary for the supplements to help prevent headache. Side effects: energizing, green urine   Coenzyme Q10: This is present  in almost all cells in the body and is critical component for the conversion of energy.  Recent studies have shown that a nutritional supplement of CoQ10 can reduce the frequency of migraine attacks by improving the energy production of cells as with riboflavin.  Doses of 150 mg twice a day have been shown to be effective.   Melatonin:  Increasing evidence shows correlation between melatonin secretion and headache conditions.  Melatonin supplementation has decreased headache intensity and duration.  It is widely used as a sleep aid.  Sleep is natures way of dealing with migraine.  A dose of 3 mg is recommended to start for headaches including cluster headache. Higher doses up to 15 mg has been reviewed for use in Cluster headache and have been used. The rationale behind using melatonin for cluster is that many theories regarding the cause of Cluster headache center around the disruption of the normal circadian rhythm in the brain.  This helps restore the normal circadian rhythm.   HEADACHE DIET: Foods and beverages which may trigger migraine Note that only 20% of headache patients are food sensitive. You will know if you are food sensitive if you get a headache consistently 20 minutes to 2 hours after eating a certain food. Only cut out a food if it causes headaches, otherwise you might remove foods you enjoy! What matters most for diet is to eat a well balanced healthy diet full of vegetables and low fat protein, and to not miss meals.   Chocolate, other sweets ALL cheeses except cottage and cream cheese Dairy products, yogurt, sour cream, ice cream Liver Meat extracts (Bovril, Marmite, meat tenderizers) Meats or fish which have undergone aging, fermenting, pickling or smoking. These include: Hotdogs,salami,Lox,sausage, mortadellas,smoked salmon, pepperoni, Pickled herring Pods of broad bean (English beans, Chinese pea pods, Italian (fava) beans, lima and navy beans Ripe avocado, ripe banana Yeast extracts or active yeast preparations such as Brewer's or Fleishman's (commercial bakes goods are permitted) Tomato based foods, pizza (lasagna, etc.)   MSG (monosodium glutamate) is disguised as many things; look for these common aliases: Monopotassium glutamate Autolysed yeast Hydrolysed protein Sodium  caseinate "flavorings" "all natural preservatives Nutrasweet   Avoid all other foods that convincingly provoke headaches.   Resources: The Dizzy Bluford Aid Your Headache Diet, migrainestrong.com  https://zamora-andrews.com/   Caffeine and Migraine For patients that have migraine, caffeine intake more than 3 days per week can lead to dependency and increased migraine frequency. I would recommend cutting back on your caffeine intake as best you can. The recommended amount of caffeine is 200-300 mg daily, although migraine patients may experience dependency at even lower doses. While you may notice an increase in headache temporarily, cutting back will be helpful for headaches in the long run. For more information on caffeine and migraine, visit: https://americanmigrainefoundation.org/resource-library/caffeine-and-migraine/   Headache Prevention Strategies:   1. Maintain a headache diary; learn to identify and avoid triggers.  - This can be a simple note where you log when you had a headache, associated symptoms, and medications used - There are several smartphone apps developed to help track migraines: Migraine Buddy, Migraine Monitor, Curelator N1-Headache App   Common triggers include: Emotional triggers: Emotional/Upset family or friends Emotional/Upset occupation Business reversal/success Anticipation anxiety Crisis-serious Post-crisis periodNew job/position   Physical triggers: Vacation Day Weekend Strenuous Exercise High Altitude Location New Move Menstrual Day Physical Illness Oversleep/Not enough sleep Weather changes Light: Photophobia or light sesnitivity treatment involves a balance between desensitization and reduction in overly strong input. Use  dark polarized glasses outside, but not inside. Avoid bright or fluorescent light, but do not dim environment to the point that going into a normally lit room hurts. Consider  FL-41 tint lenses, which reduce the most irritating wavelengths without blocking too much light.  These can be obtained at axonoptics.com or theraspecs.com Foods: see list above.   2. Limit use of acute treatments (over-the-counter medications, triptans, etc.) to no more than 2 days per week or 10 days per month to prevent medication overuse headache (rebound headache).     3. Follow a regular schedule (including weekends and holidays): Don't skip meals. Eat a balanced diet. 8 hours of sleep nightly. Minimize stress. Exercise 30 minutes per day. Being overweight is associated with a 5 times increased risk of chronic migraine. Keep well hydrated and drink 6-8 glasses of water per day.   4. Initiate non-pharmacologic measures at the earliest onset of your headache. Rest and quiet environment. Relax and reduce stress. Breathe2Relax is a free app that can instruct you on    some simple relaxtion and breathing techniques. Http://Dawnbuse.com is a    free website that provides teaching videos on relaxation.  Also, there are  many apps that   can be downloaded for "mindful" relaxation.  An app called YOGA NIDRA will help walk you through mindfulness. Another app called Calm can be downloaded to give you a structured mindfulness guide with daily reminders and skill development. Headspace for guided meditation Mindfulness Based Stress Reduction Online Course: www.palousemindfulness.com Cold compresses.   5. Don't wait!! Take the maximum allowable dosage of prescribed medication at the first sign of migraine.   6. Compliance:  Take prescribed medication regularly as directed and at the first sign of a migraine.   7. Communicate:  Call your physician when problems arise, especially if your headaches change, increase in frequency/severity, or become associated with neurological symptoms (weakness, numbness, slurred speech, etc.). Proceed to emergency room if you experience new or worsening symptoms or  symptoms do not resolve, if you have new neurologic symptoms or if headache is severe, or for any concerning symptom.   8. Headache/pain management therapies: Consider various complementary methods, including medication, behavioral therapy, psychological counselling, biofeedback, massage therapy, acupuncture, dry needling, and other modalities.  Such measures may reduce the need for medications. Counseling for pain management, where patients learn to function and ignore/minimize their pain, seems to work very well.   9. Recommend changing family's attention and focus away from patient's headaches. Instead, emphasize daily activities. If first question of day is 'How are your headaches/Do you have a headache today?', then patient will constantly think about headaches, thus making them worse. Goal is to re-direct attention away from headaches, toward daily activities and other distractions.   10. Helpful Websites: www.AmericanHeadacheSociety.org patenthood.ch www.headaches.org tightmarket.nl www.achenet.org

## 2024-03-05 NOTE — Progress Notes (Signed)
 "  PATIENT: Katie Roberts DOB: 03-08-2000  REASON FOR VISIT: follow up HISTORY FROM: patient  Virtual Visit via Mychart Video Note  I connected with Raguel SHAUNNA Irving on 03/05/24 at  9:30 AM EST by video and verified that I am speaking with the correct person using two identifiers.   I discussed the limitations, risks, security and privacy concerns of performing an evaluation and management service by video and the availability of in person appointments. I also discussed with the patient that there may be a patient responsible charge related to this service. The patient expressed understanding and agreed to proceed.   History of Present Illness:  03/05/24 ALL (Mychart): Katie Roberts returns for follow up for migraines. She continues Ajovy  and sumatriptan . Migraines remain very well managed. She rarely has a headache. Sumatriptan  does work. Usually takes when Ajovy  wears off.   02/27/2023 ALL (Mychart):  Katie Roberts is a 24 y.o. female here today for follow up for migraines. She was last seen by [name] 12/2021 and doing well on Ajovy  and sumatriptan . Since, she reports doing well. She may have 1-2 migrainous headaches a month. Sumatriptan  works well. She can't remember the last time she needed to refill. She is currently in school in [place]. No concerns, today.   History (copied from Dr Sharion previous note)  01/14/2022 (Mychart): No side effects to Ajovy . Extremely improved on Ajovy . She has not woken up or gone to bed with a headache. And the ones she has are mild. imitres works. Continue current regimen, discussed nurtec/ubrelvy but imitrex  works   Reviewed images and agree: (additional 10 minutes to appointment time) CTA NECK FINDINGS   Aortic arch: Standard branching. Imaged portion shows no evidence of aneurysm or dissection. No significant stenosis of the major arch vessel origins.   Right carotid system: Detail limited by mild motion. Right carotid system appears normal without  stenosis or dissection.   Left carotid system: Detail limited by motion. Normal left carotid without stenosis or dissection.   Vertebral arteries: Both vertebral arteries are patent. Detail limited by motion.   Skeleton: Negative   Other neck: Negative for mass or adenopathy in the neck.   Upper chest: Lung apices clear bilaterally.   Review of the MIP images confirms the above findings   CTA HEAD FINDINGS   Anterior circulation: Internal carotid artery normally through the skull base and cavernous segment. Anterior and middle cerebral arteries normal bilaterally   Posterior circulation: Both vertebral arteries patent to the basilar. PICA patent bilaterally. Basilar widely patent. Superior cerebellar and posterior cerebral arteries patent bilaterally.   Venous sinuses: Normal venous enhancement   Anatomic variants: None   Review of the MIP images confirms the above findings   IMPRESSION: 1. Image quality degraded by patient motion especially in the neck component of the study. 2. Negative for carotid dissection. Normal carotid and vertebral arteries, allowing for motion 3. Normal CT angio head 4. Normal CT head    Patient complains of symptoms per HPI as well as the following symptoms: migraine . Pertinent negatives and positives per HPI. All others negative   HPI 09/11/2021:  Katie Roberts is a 24 y.o. female here as requested by [name] found for migraines.  She has a past medical history of anxiety, migraines, TMJ pain, sternocleidomastoid muscle tenderness, mixed obsessional thoughts and acts, low ferritin, hair loss, generalized abdominal pain, fatigue, bone marrow donor, B12 deficiency, ADHD, adjustment disorder with anxious mood.  I reviewed notes from  [employer] sales executive who also mentioned OCD, anxiety, depression.  She has a therapist and a psychiatrist that appears.  I reviewed pediatric neurology's notes Dr. Norwood Abu who she has been seeing for headaches  and migraines.  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  Neurologic exam which was extensive was normal.  Last they tried amitriptyline  replacing propranolol , continued her on magnesium and vitamin B2, it does not appear as though brain imaging was completed.  It appears [facility], saw Waddell Ada, PA, noted she was getting lots of migraines because of exams, sumatriptan  not helping, associated dizziness and nausea, pressure around the ears, photophobic, a Toradol shot was prescribed.   Patient is here and reports that her headaches have been ongoing for many years, mostly right-sided, with tbut around the eye area, photophobia, phonophobia, pulsating pounding throbbing, hurts to move, no aura, nausea no vomiting. She has at least 8 migraine days a month, daily headaches ongoing for > 1 year (started getting headaches since age of 40) radiates to the right side of the neck which is new and worsening (points to the right carotid artery), in the last year radiates to the right side of the neck, worsened since January. At least 24 hours and overnight and worse in the mornings supine, gets blurry vision, can wake with headaches/migraines, sleep and stress can be a trigger, they can be moderate to severe and interfering with life. We spoke about aura and vertigo. She has vertigo and dizziness. No other focal neurologic deficits, associated symptoms, inciting events or modifiable factors.   Reviewed notes, labs and imaging from outside physicians,  which showed:   From a thorough review of records, medications tried that can be used in migraine management include: Propranolol , SSRIs, amitriptyline , Flexeril , Lexapro , Prozac , Medrol  Dosepak, riboflavin B2, Zoloft , sumatriptan (helps),maxalt, topiramate contraindicated due to depression.    Observations/Objective:  Generalized: Well developed, in no acute distress  Mentation: Alert oriented to time, place, history taking. Follows all commands speech and language fluent   Assessment and Plan:  24 y.o. year old female  has a past medical history of Anxiety and Migraine. here with    ICD-10-CM   1. Chronic migraine without aura without status migrainosus, not intractable  G43.709       Nazaret is doing well. Migraines well managed on Ajovy  and sumatriptan . She was advised to continue current treatment plan. Healthy lifestyle habits encouraged. She initially requested follow up visit with [name] but agrees to schedule with me if doing well. We can schedule with [name] if she wishes. Annual follow up advised.    No orders of the defined types were placed in this encounter.   Meds ordered this encounter  Medications   Fremanezumab -vfrm (AJOVY ) 225 MG/1.5ML SOAJ    Sig: Inject 225 mg into the skin every 30 (thirty) days.    Dispense:  4.5 mL    Refill:  3    Supervising Provider:   YAN, YIJUN [[id number]]   SUMAtriptan  (IMITREX ) 100 MG tablet    Sig: May  repeat in 2 hours if headache persists or recurs.    Dispense:  9 tablet    Refill:  11    Supervising Provider:   YAN, YIJUN [id number]     Follow Up Instructions:  I discussed the assessment and treatment plan with the patient. The patient was provided an opportunity to ask questions and all were answered. The patient agreed with the plan and demonstrated an understanding of the instructions.   The patient was advised to call back or seek an in-person evaluation if the symptoms worsen or if the condition fails to improve as  anticipated.  I provided 15 minutes of non-face-to-face time during this encounter. Patient located at their place of residence during Mychart visit. Provider is in the office.    Casaundra Takacs, NP  "

## 2025-03-08 ENCOUNTER — Telehealth: Admitting: Family Medicine
